# Patient Record
Sex: Female | Born: 1985 | Hispanic: Yes | Marital: Married | Smoking: Never smoker
Health system: Southern US, Community
[De-identification: ages and names within clinical notes are randomized; demographics above are authoritative.]

## PROBLEM LIST (undated history)

## (undated) DIAGNOSIS — E119 Type 2 diabetes mellitus without complications: Principal | ICD-10-CM

## (undated) DIAGNOSIS — Z3A23 23 weeks gestation of pregnancy: Secondary | ICD-10-CM

## (undated) DIAGNOSIS — Z3A25 25 weeks gestation of pregnancy: Secondary | ICD-10-CM

## (undated) DIAGNOSIS — O24419 Gestational diabetes mellitus in pregnancy, unspecified control: Secondary | ICD-10-CM

## (undated) HISTORY — PX: NO PAST SURGERIES: SHX2092

## (undated) HISTORY — DX: Type 2 diabetes mellitus without complications: E11.9

## (undated) HISTORY — DX: Gestational diabetes mellitus in pregnancy, unspecified control: O24.419

## (undated) MED FILL — INSULIN NPH ISOPHANE U-100 HUMAN 100 UNIT/ML SUBCUTANEOUS SUSPENSION: 100 100 unit/mL | SUBCUTANEOUS | 28 days supply | Qty: 10 | Fill #0

## (undated) MED FILL — INSULIN ASPART U-100  100 UNIT/ML SUBCUTANEOUS SOLUTION: 100 100 unit/mL | SUBCUTANEOUS | 28 days supply | Qty: 10 | Fill #0

## (undated) MED FILL — BLOOD-GLUCOSE METER: 30 days supply | Qty: 1 | Fill #0

---

## 2007-02-08 ENCOUNTER — Inpatient Hospital Stay

## 2010-09-17 ENCOUNTER — Ambulatory Visit: Admit: 2010-09-17 | Discharge: 2010-09-17 | Attending: Maternal & Fetal Medicine

## 2010-09-17 ENCOUNTER — Inpatient Hospital Stay: Attending: Maternal & Fetal Medicine

## 2010-09-17 DIAGNOSIS — Z36 Encounter for antenatal screening of mother: Secondary | ICD-10-CM

## 2010-09-21 ENCOUNTER — Institutional Professional Consult (permissible substitution): Admit: 2010-09-21 | Attending: Adult Health

## 2010-09-21 ENCOUNTER — Inpatient Hospital Stay

## 2010-09-21 DIAGNOSIS — E119 Type 2 diabetes mellitus without complications: Secondary | ICD-10-CM

## 2010-09-21 LAB — POC GLU MONITORING DEVICE: POC Glucose Monitoring Device: 93 mg/dL (ref 65–99)

## 2010-09-21 NOTE — Unmapped (Signed)
PATIENTS RANDOM BLOOD SUGAR FOR 09/21/10 WAS 93

## 2010-09-22 MED ORDER — blood sugar diagnostic Strp
ORAL_STRIP | Freq: Every day | 0.00 refills | 30.00000 days | Status: AC
Start: 2010-09-22 — End: 2014-04-01

## 2010-09-22 MED ORDER — blood-glucose meter kit
1.00 refills | 30.00000 days | Status: AC
Start: 2010-09-22 — End: 2011-09-22

## 2010-09-22 NOTE — Unmapped (Signed)
Nuchal done on 09/17/10. EDD 04/13/11.

## 2010-09-23 NOTE — Unmapped (Signed)
1 1/2 hours spent with pt.  Spanish interpreter Porfirio Mylar present for education.  Pt originally from [place] and has been in the states 4 years.  Understands little [place] but can read [place].  All educational materials provided in Spanish    25 yo G2P1001 @ [redacted] wks gestation with type 2 diabetes.  Pt referred from [facility] to DAPP.  Pt has been on metformin BID although pt states today that she has not taken in 8 days because it makes her feel sick.  Pt presents with BG log but is only testing fasting and after dinner.  Majority of BGs are WNL.  24 hour diet recall completed which shows pt somewhat carb restricting.  The only education she received was to reduce carb intake which resulted in her eliminating majority of carbs (rice, potatoes, juice, pasta, tortillas etc.)  Discussed importance of incorporating carbs in meal plan and healthy vs unhealthy choices.  Pt seemed happy to receive education as she was unaware of proper portion sizes.  Pt given meal plan based on BMI of 25 as follows:  Breakfast: Time: 7-8am Carbs:30 g    Snack:  Time: 9-10amCarbs: 15 g    Lunch:  Time: 12pm Carbs: 60 g    Snack:  Time: 2-3pm Carbs: 15 g    Dinner: Time: 5-6pm Carbs: 60 g    Snack:  Time:9-10pm Carbs:  30 g     Discussed with N.Linter CNS who would like pt to stop Metformin since she has not been taking for 8 days and FSBG are mostly WNL.  Pt instructed to start testing 7x/day to obtain a more accurate glycemic profile.  Pt is scheduled to return to office Thursday 8/30 at 10 am to see N.Lintner CNS and have A1C drawn. Pt given True Result meter as she does not have insurance and was paying out of pocket for testing supplies for the [employer].  Pt instructed to bring glucometer(s) and BG log to visit.

## 2010-09-24 ENCOUNTER — Inpatient Hospital Stay

## 2010-09-24 ENCOUNTER — Encounter: Attending: Clinical

## 2010-09-24 ENCOUNTER — Ambulatory Visit: Admit: 2010-09-24 | Attending: Adult Health

## 2010-09-24 DIAGNOSIS — E119 Type 2 diabetes mellitus without complications: Secondary | ICD-10-CM

## 2010-09-24 LAB — POC URINALYSIS
Bilirubin, UA: NEGATIVE
Blood, POC, UA: NEGATIVE
Glucose, POC, UA: NEGATIVE mg/dL
Ketones, POC, UA: NEGATIVE mg/dL
Nitrite, POC, UA: NEGATIVE
Protein, POC, UA: NEGATIVE mg/dL
Spec Grav, UA: >=1.03 (ref 1.005–1.035)
Urobilinogen, POC, UA: 0.2 mg/dL (ref 0.2–1.0)
pH, POC,UA: 5.5 (ref 5.0–8.0)

## 2010-09-24 LAB — GLUCOSE CHALLENGE, 1 HOUR: Glucose Challenge, 1 Hr: 107 mg/dL

## 2010-09-24 LAB — HEMOGLOBIN A1C: Hemoglobin A1C: 5.4 % (ref 4.8–6.4)

## 2010-09-24 MED ORDER — prenatal vit-iron fumarate-FA 27-1 mg Tab
27-1 | ORAL_TABLET | Freq: Every day | ORAL | 3.00 refills | 30.00000 days | Status: AC
Start: 2010-09-24 — End: 2010-10-24

## 2010-09-24 NOTE — Unmapped (Signed)
25 y/o Hispanic female presents at 75 w 2d with hx of T2DM.  Pt reports that she has not taken her Metformin for 15 days.  BGs reviewed. See assessment. Patient denies pregnancy complaints except for constipation and H/A.  Pt was instructed to stop stool softener because she is pregnant.  Pt states that she was dx'd with T2DM at one of the outlying clinics with a FSBG > 200 mg/dL and a urine.  Pt reports losing 40 lbs since then with bgs no greater than 120 mg/dL.  Pt reports 1st trimester H/A.  Assessment:  U/S on 09/17/10 reveals that pt is 10.2 wks pregnancy with a final EDD of 04/13/11.  Self reported BGs:  FBG 82, 83, 90; 1 hr PP breakfast 138, 95; ac lunch 81, 74; 1 hr PP lunch 87, 84; ac dinner 89, 78, 85; 1 hr PP dinner 117, 129, 108; pre-hs snack 89, 73 mg/dL.    Plan:  A1c, 1 hr GCT, urine dipstick.  Counseling about how to maintain a healthy pregnancy.  Discussed how insulin requirements increase throughout pregnancy beginning at 24 wks and she is at risk for GDM. F/U after lab results reviewed.  Ca++ 1000 mg for c/o 1st trimester H/As.  Report to Dr. Logan Bores. Kathyrn Sheriff. Atsushi Yom, MS, ACNS, RNC-OB
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
## 2010-09-24 NOTE — Unmapped (Addendum)
Spanish Interpreter present.  Informed pt that we would like to order A1c, urine dipstick and 1 hr gct since her bgs have been normal for the last 15 days.  Take CA++ 1000 mg for c/o 1st trimester H/As. Inst pt how to take Tylenol for H/A.  Inst pt how to take Senokot for constipation and to begin PNVs as instructed.   Kathyrn Sheriff. Kemi Gell, MS, ACNS, RNC-OB

## 2010-10-07 ENCOUNTER — Inpatient Hospital Stay

## 2010-10-07 ENCOUNTER — Other Ambulatory Visit: Admit: 2010-10-07 | Attending: Adult Health

## 2010-10-07 ENCOUNTER — Encounter: Attending: Clinical

## 2010-10-07 ENCOUNTER — Ambulatory Visit

## 2010-10-07 ENCOUNTER — Ambulatory Visit: Admit: 2010-10-07 | Attending: Maternal & Fetal Medicine

## 2010-10-07 DIAGNOSIS — Z36 Encounter for antenatal screening of mother: Secondary | ICD-10-CM

## 2010-10-07 DIAGNOSIS — F32A Depression, unspecified: Secondary | ICD-10-CM

## 2010-10-07 NOTE — Unmapped (Signed)
25 y/o Hispanic female presents at 13.1 wks for follow visit with APRN and NT in diagnostics.  Pt present on 09/24/10 w/previous hx of T2DM.  Pt was tested and passed 1 hr GCT.  BGs reviewed.  Optimal glycemic control noted.  3 bgs outside targets for pregnancy over the last 2 wks.  Inst pt with assistance of interpreter that pt may return to low risk clinic for OB care.  Pt states she will got to [facility].  Inst pt to reinitiate bg testing if she develops excessive thirst or urination.  Inst pt to request records for transfer of care.  Inst pt to have 1 hr GCT and 3 hr OGTT between 24-28 wks to ensure that she has not developed GDM.  Pt agrees to plan. Kathyrn Sheriff. Kateena Degroote, MS, ACNS, RNC-OB

## 2010-10-07 NOTE — Unmapped (Signed)
Take ROR to new provider to get record from [facility].  Reinitiate bg testing if s/s excessive thirst or urination. Enc pt to drink SF beverages, watch portions and avoid excessive wt gain during pregnancy.  F/U for 1 hr GCT and 3 hr OGTT if necessary to r/o GDM between 24-28 wks. Pt agrees to plan.  Kathyrn Sheriff. Talise Sligh, MS, ACNS, RNC-OB

## 2010-10-07 NOTE — Unmapped (Signed)
SW met w/pt & Energy manager.  SW informed pt need to sign another ROI for [employer] C[employer], as had due date on last request, yet pt wants no medical information shared.  Pt signed form.  SW will call pt at [phone] after contacts CPS.

## 2010-10-07 NOTE — Unmapped (Signed)
SW called [employer] - informed of request.  Fax request to  [phone].  SW faxed request.     Cassandria Anger of [employer] returned call.  SW explained request of birth mom.  He questions why pt doesn't call agency directly.  Pt does not think they answer her questions.  Mr. Kennyth Arnold stated pt needs to call him at [phone] with Interpreter or if he knows when she's going to call, he can have interpreter available.  SW appreciative for help.     SW called pt 10/20/10 - via Interpreter [id number].  Inform pt of above - pt has no one to translate so request SW call Mr. Kennyth Arnold back & arrange phone call for 10/22/10 at 10am.  Gave pt his phone number.  SW called Mr. Kennyth Arnold - must go through phone tree, then asked for him, LVMM to see if he has direct line as pt won't be able to navigate phone tree on own, yet planning to call Thurs 10/22/10 at 10am.  Request call back    SW called pt - [employer] [phone] - to see if pt called [facility].  PT didn't have minutes on Thurs, so called Fri, yet couldn't understand phone options.  SW will call [facility] & call pt back with specific directions so can know status of situation.     SW called Mr. Michel Bickers 10/28/10 - SW explained pt attempted to call, yet can't navigate phone tree.  He states pt needs to speak to her CM - Rayburn Felt or her attorney Asencion Gowda [phone], as he is not involved in the case.  SW then called main # [phone] - LVMM for Rayburn Felt to set phone appointment for pt to call him, with [facility] Interpreter to assist - request call back.

## 2010-10-20 NOTE — Unmapped (Signed)
PROCEDURE ONLY EPIC BILLING

## 2010-11-13 NOTE — Unmapped (Signed)
SW called pt via [employer].  SW inform pt of phone call with [employer] - directed to her case worker Rayburn Felt or her attorney Asencion Gowda.  Pt stated that her attorney called her last week & is to call her next week.  SW offered for pt to contact out Interpreting Dept if issues not resolved or if needs assistance with interpreting, as they can contact SW.  Pt stated understanding.  Pt then asked if this SW knows anything about her daughter.  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  Wish pt well.

## 2011-02-07 NOTE — Unmapped (Signed)
Phone call not indicated  Patient stated she would continue prenatal care at [facility]  See note by N Lintner   10/07/2010

## 2011-02-23 NOTE — Unmapped (Signed)
SW returned call as pt requested help w/newborn classes.  Used [employer] - pt is delivering at [facility] yet missed their classes, so wondered if [facility] had any.  SW agreed to locate current schedule & call pt back.  SW located info - call pt & direct her to call [phone].  Also suggest pt work with [employer] Spanish SW Sports coach.  Pt stated she is already.  Appreciative of help.

## 2011-03-14 NOTE — Unmapped (Signed)
Patient has transferred care to [facility] per phone note 02/23/11 by Fredrich Birks.
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
## 2012-01-26 NOTE — L&D Delivery Note (Signed)
Delivery Note Pt had SROM at 0140 with copious amounts of clear fluid. At 0200, she was C/C/+2 with an urge to push. At 2:12 AM a viable female was delivered via Vaginal, Spontaneous Delivery (Presentation:LOA ;  ).  APGAR: 9, 9; weight pending .   Placenta status: Intact, Spontaneous.  Cord: 3 vessels with the following complications: None.   Anesthesia: Epidural  Episiotomy: None Lacerations: 1st degree perineal Suture Repair: 2.0 vicryl Est. Blood Loss (mL): 400  Mom to postpartum.  Baby to nursery-stable  Delivery and repair by Dr. Berline Chough under my supervision.  [name],[name] 05/19/2012, 2:23 AM

## 2012-04-25 ENCOUNTER — Other Ambulatory Visit: Payer: Self-pay | Admitting: Obstetrics & Gynecology

## 2012-04-25 ENCOUNTER — Other Ambulatory Visit (INDEPENDENT_AMBULATORY_CARE_PROVIDER_SITE_OTHER): Payer: Self-pay

## 2012-04-25 DIAGNOSIS — Z3201 Encounter for pregnancy test, result positive: Secondary | ICD-10-CM

## 2012-04-25 LAB — HIV ANTIBODY (ROUTINE TESTING W REFLEX): HIV: NONREACTIVE

## 2012-04-25 LAB — POCT PREGNANCY, URINE: Preg Test, Ur: POSITIVE — AB

## 2012-04-26 ENCOUNTER — Ambulatory Visit (HOSPITAL_COMMUNITY)
Admission: RE | Admit: 2012-04-26 | Discharge: 2012-04-26 | Disposition: A | Discharge status: Home / Self Care | Payer: Self-pay | Source: Ambulatory Visit | Attending: Obstetrics & Gynecology | Admitting: Obstetrics & Gynecology

## 2012-04-26 ENCOUNTER — Other Ambulatory Visit: Payer: Self-pay | Admitting: Obstetrics & Gynecology

## 2012-04-26 DIAGNOSIS — O99810 Abnormal glucose complicating pregnancy: Secondary | ICD-10-CM | POA: Insufficient documentation

## 2012-04-26 DIAGNOSIS — Z3201 Encounter for pregnancy test, result positive: Secondary | ICD-10-CM

## 2012-04-26 DIAGNOSIS — Z3689 Encounter for other specified antenatal screening: Secondary | ICD-10-CM | POA: Insufficient documentation

## 2012-04-26 LAB — OBSTETRIC PANEL
Antibody Screen: NEGATIVE
Basophils Relative: 0 % (ref 0–1)
Eosinophils Relative: 0 % (ref 0–5)
HCT: 35.1 % — ABNORMAL LOW (ref 36.0–46.0)
Hemoglobin: 11.9 g/dL — ABNORMAL LOW (ref 12.0–15.0)
MCHC: 33.9 g/dL (ref 30.0–36.0)
MCV: 80.9 fL (ref 78.0–100.0)
Monocytes Absolute: 0.6 10*3/uL (ref 0.1–1.0)
Monocytes Relative: 6 % (ref 3–12)
Neutro Abs: 7.3 10*3/uL (ref 1.7–7.7)
RDW: 14.2 % (ref 11.5–15.5)
Rh Type: POSITIVE
Rubella: 13.3 Index — ABNORMAL HIGH (ref <0.90)

## 2012-04-27 LAB — HEMOGLOBINOPATHY EVALUATION: Hgb A2 Quant: 2.9 % (ref 2.2–3.2)

## 2012-05-08 ENCOUNTER — Encounter: Payer: Self-pay | Admitting: Obstetrics and Gynecology

## 2012-05-08 ENCOUNTER — Ambulatory Visit (INDEPENDENT_AMBULATORY_CARE_PROVIDER_SITE_OTHER): Payer: Self-pay | Admitting: Obstetrics and Gynecology

## 2012-05-08 VITALS — BP 112/89 | Temp 97.2°F | Ht <= 58 in | Wt 163.0 lb

## 2012-05-08 DIAGNOSIS — O24919 Unspecified diabetes mellitus in pregnancy, unspecified trimester: Secondary | ICD-10-CM

## 2012-05-08 DIAGNOSIS — O24913 Unspecified diabetes mellitus in pregnancy, third trimester: Secondary | ICD-10-CM

## 2012-05-08 DIAGNOSIS — O24119 Pre-existing diabetes mellitus, type 2, in pregnancy, unspecified trimester: Secondary | ICD-10-CM | POA: Insufficient documentation

## 2012-05-08 DIAGNOSIS — Z3403 Encounter for supervision of normal first pregnancy, third trimester: Secondary | ICD-10-CM

## 2012-05-08 DIAGNOSIS — E119 Type 2 diabetes mellitus without complications: Secondary | ICD-10-CM

## 2012-05-08 DIAGNOSIS — O0930 Supervision of pregnancy with insufficient antenatal care, unspecified trimester: Secondary | ICD-10-CM

## 2012-05-08 DIAGNOSIS — O0933 Supervision of pregnancy with insufficient antenatal care, third trimester: Secondary | ICD-10-CM

## 2012-05-08 LAB — COMPREHENSIVE METABOLIC PANEL
ALT: 10 U/L (ref 0–35)
AST: 12 U/L (ref 0–37)
Alkaline Phosphatase: 186 U/L — ABNORMAL HIGH (ref 39–117)
Potassium: 4.1 mEq/L (ref 3.5–5.3)
Sodium: 135 mEq/L (ref 135–145)
Total Bilirubin: 0.4 mg/dL (ref 0.3–1.2)
Total Protein: 5.9 g/dL — ABNORMAL LOW (ref 6.0–8.3)

## 2012-05-08 LAB — POCT URINALYSIS DIP (DEVICE)
Bilirubin Urine: NEGATIVE
Glucose, UA: >=1000 mg/dL — AB
Nitrite: NEGATIVE
Specific Gravity, Urine: >=1.03 (ref 1.005–1.030)
Urobilinogen, UA: 1 mg/dL (ref 0.0–1.0)

## 2012-05-08 LAB — CBC
MCH: 27.1 pg (ref 26.0–34.0)
MCHC: 34 g/dL (ref 30.0–36.0)
Platelets: 283 10*3/uL (ref 150–400)

## 2012-05-08 MED ORDER — GLYBURIDE 2.5 MG PO TABS: 2.5000 mg | ORAL_TABLET | Freq: Two times a day (BID) | ORAL | Status: DC

## 2012-05-08 MED ORDER — PRENATAL VITAMINS 0.8 MG PO TABS: 1.0000 | ORAL_TABLET | Freq: Every day | ORAL | Status: DC

## 2012-05-08 NOTE — Progress Notes (Signed)
Nutrition note: 1st visit consult Pt has Type 2 DM and h/o obesity. Pt has gained 13# @ [redacted]w[redacted]d, which is wnl. Pt reports eating 3x/d. Pt reports having no N&V or heartburn but some constipation.  Pt is not taking PNV. Pt received verbal & written education on GDM diet in Spanish. Disc importance of PNV. Disc tips to decrease constipation.  Disc wt gain goals of 11-20# or 0.5#/wk. Pt agrees to start taking PNV and follow GDM diet with 3 meals & 3 snacks/d with proper CHO/protein combination. Pt does not have WIC but plans to apply. Pt plans to BF. F/u in 2-4 wks Blondell Reveal, MS, RD, LDN

## 2012-05-08 NOTE — Patient Instructions (Signed)
Embarazo  Systems analyst trimestre  (Pregnancy - Third Trimester) El tercer trimestre del Psychiatrist (los ltimos 3 meses) es el perodo en el cual tanto usted como su beb crecen con ms rapidez. El beb alcanza un largo de aproximadamente 50 cm. y pesa entre 2,700 y 4,500 kg. El beb gana ms tejido graso y est listo para la vida fuera del cuerpo de la [place]. Mientras estn en el interior, los bebs tienen perodos de sueo y vigilia, Warehouse manager y tienen hipo. Quizs sienta pequeas contracciones del tero. Este es el falso trabajo de [place]. Tambin se las conoce como contracciones de [place] . Es como una prctica del parto. Los problemas ms habituales de esta etapa del embarazo incluyen mayor dificultad para respirar, hinchazn de las manos y los pies por retencin de lquidos y la necesidad de Geographical information systems officer con ms frecuencia debido a que el tero y el beb presionan sobre la vejiga.  EXAMENES PRENATALES   Durante los Manpower Inc, deber seguir realizndose anlisis de [place]. Estas pruebas se realizan para controlar su salud y la del beb. Los [employer] Corporation de sangre se Radiographer, therapeutic para [employer] niveles de algunos compuestos de la sangre (hemoglobina). La anemia (bajo nivel de hemoglobina) es frecuente durante el embarazo. Para prevenirla, se administran hierro y vitaminas. Tambin le tomarn nuevas anlisis para descartar diabetes. Podrn repetirle algunas de las Hovnan[employer] Enterprises hicieron previamente.  En cada visita le medirn el tamao del tero. Esto permite asegurar que el beb se desarrolla adecuadamente, segn la fecha del embarazo.  Le controlarn la presin arterial en cada visita prenatal. Esto es para asegurarse de que no sufre toxemia.  Le harn un anlisis de orina en cada visita prenatal, para descartar infecciones, diabetes y la presencia de protenas.  Tambin en cada visita controlarn su peso. Esto se realiza para asegurarse que aumenta de peso al ritmo indicado y que usted y  su beb evolucionan normalmente.  En algunas ocasiones se realiza una prueba de ultrasonido para confirmar el correcto desarrollo y evolucin del beb. Esta prueba se realiza con ondas sonoras inofensivas para el beb, de modo que el profesional pueda calcular ms precisamente la fecha del [place].  Analice con su mdico los analgsicos y la anestesia que recibir durante el [place] de parto y [place].  Comente la posibilidad de que necesite una cesrea y qu anestesia se recibir.  Informe a su mdico si sufre violencia familiar mental o fsica. A veces, se indica la prueba especializada sin estrs, la prueba de tolerancia a las contracciones y el perfil biofsico para asegurarse de que el beb no tiene problemas. El estudio del lquido amnitico que rodea al beb se llama amniocentesis. El lquido amnitico se obtiene introduciendo una aguja en el vientre (abdomen ). En ocasiones se lleva a cabo cerca del final del embarazo, si es necesario inducir a un parto. En este caso se realiza para asegurarse que los pulmones del beb estn lo suficientemente maduros como para que pueda vivir fuera del tero. Si los pulmones no han madurado y es peligroso que el beb nazca, se Building services engineer a la madre una inyeccin de [place] , 1 a 2 [address]  [address] antes del [address]. Vivia Budge ayuda a que los pulmones del beb maduren y sea ms seguro su nacimiento.  CAMBIOS QUE OCURREN EN EL TERCER TRIMESTRE DEL EMBARAZO  Su organismo atravesar numerosos cambios durante el [place]. Estos pueden variar de Neomia Dear persona a otra. Converse con el profesional que la asiste acerca los cambios que  usted note y que la preocupen.   Durante el ltimo trimestre probablemente sienta un aumento del apetito. Es normal tener "antojos" de Development worker, community. Esto vara de Neomia Dear persona a otra y de un embarazo a Therapist, art.  Podrn aparecer las primeras estras en las caderas, abdomen y [place]. Estos son cambios normales del cuerpo durante el [place]. No existen  medicamentos ni ejercicios que puedan prevenir [employer]Max.  La constipacin puede tratarse con un laxante o agregando fibra a su dieta. Beber grandes cantidades de lquidos, tomar fibras en forma de vegetales, frutas y granos integrales es de gran [place].  Tambin es beneficioso practicar actividad fsica. Si ha sido una persona Engineer, mining, podr continuar con la Harley[employer]son de las actividades durante el mismo. Si ha sido [employer], puede ser beneficioso que comience con un programa de ejercicios, Museum/gallery exhibitions officer. Consulte con el profesional que la asiste antes de comenzar un programa de ejercicios.  Evite el consumo de cigarrillos, el alcohol, los medicamentos no recetados y las "drogas de la calle" durante el Psychiatrist. Estas sustancias qumicas afectan la formacin y el desarrollo del beb. Evite estas sustancias durante todo el embarazo para asegurar el nacimiento de un beb sano.  Podr sentir dolor de espalda, tener vrices en las venas y hemorroides, o si ya los sufra, pueden [place].  Durante el tercer trimestre se cansar con ms facilidad, lo cual es normal.  Los movimientos del beb pueden ser ms fuertes y con ms frecuencia.  Puede que note dificultades para respirar normalmente.  El ombligo puede salir hacia afuera.  A veces sale Veterinary surgeon de las [place], que se llama Product manager.  Podr aparecer Neomia Dear secrecin mucosa con sangre. Esto suele ocurrir General Electric unos [address] y Neomia Dear semana antes del [place]. INSTRUCCIONES PARA EL CUIDADO EN EL HOGAR   Cumpla con las citas de control. Siga las indicaciones del mdico con respecto al uso de [place], los ejercicios y la dieta.  Durante el embarazo debe obtener nutrientes para usted y para su beb. Consuma alimentos balanceados a intervalos regulares. Elija alimentos como carne, pescado, [place] y otros productos lcteos descremados, vegetales, frutas, panes integrales y cereales. El Office [employer]ot informar  cul es el aumento de peso ideal.  Las relaciones sexuales pueden continuarse hasta casi el final del embarazo, si no se presentan otros problemas como prdida prematura (antes de [place]) de lquido amnitico, hemorragia vaginal o dolor en el vientre (abdominal).  Realice Tesoro Corporation, si no tiene restricciones. Consulte con el profesional que la asiste si no sabe con certeza si determinados ejercicios son seguros. El mayor aumento de peso se producir en los ltimos 2 trimestres del Psychiatrist. El ejercicio ayuda a:  Engineering geologist.  Mantenerse en forma para el trabajo de parto y [place] .  Perder peso despus del parto.  Haga reposo con frecuencia, con las piernas elevadas, o segn lo necesite para evitar los calambres y el dolor de cintura.  Use un buen sostn o como los que se usan para hacer deportes para Paramedic la sensibilidad de las [place]. Tambin puede serle til si lo [place] mientras duerme. Si pierde Product manager, podr Parker Hann[employer]in.  No utilice la baera con agua caliente, baos turcos y saunas.  Colquese el cinturn de seguridad cuando conduzca. Este la proteger a usted y al beb en caso de accidente.  Evite comer carne cruda y el contacto con los utensilios y desperdicios de los gatos. Estos elementos  contienen grmenes que pueden causar defectos de nacimiento en el beb.  Es fcil perder algo de orina durante el [place]. Apretar y Chief Operating Officer los msculos de la pelvis la ayudar con este problema. Practique detener la miccin cuando est en el bao. Estos son los mismos msculos que Development worker, international aid. Son [employer]PP[employer] Partners mismos msculos que utiliza cuando trata de evitar despedir gases. Puede practicar apretando estos msculos WellPoint, y repetir esto tres veces por da aproximadamente. Una vez que conozca qu msculos debe apretar, no realice estos ejercicios durante la miccin. Puede favorecerle una infeccin si la orina vuelve hacia  atrs.  Pida ayuda si tienen necesidades financieras, teraputicas o nutricionales. El profesional podr ayudarla con respecto a estas necesidades, o derivarla a otros especialistas.  Haga una lista de nmeros telefnicos de emergencia y tngalos disponibles.  Planifique como obtener ayuda de familiares o amigos cuando regrese a Programmer, applications hospital.  Hacer un ensayo sobre la partida al hospital.  [place]aven clases prenatales con el padre para entender, practicar y hacer preguntas sobre el [place] de parto y el alumbramiento.  Preparar la habitacin del beb / busque Fatima Blank.  No viaje fuera de la ciudad a menos que sea absolutamente necesario y con el asesoramiento de su mdico.  Use slo zapatos de tacn bajo o sin tacn para tener mejor equilibrio y Automotive engineer cadas. USO DE MEDICAMENTOS Y CONSUMO DE DROGAS DURANTE EL [facility]   Tome las vitaminas apropiadas para esta etapa tal como se le indic. Las vitaminas deben contener un miligramo de cido flico. Guarde todas las vitaminas fuera del alcance de los nios. La ingestin de slo un par de vitaminas o tabletas que contengan hierro pueden ocasionar la Newmont Mining en un beb o en un nio pequeo.  Evite el uso de [employer] Mutual [employer] Omaha, incluyendo hierbas, medicamentos de [place], sin receta o que no hayan sido sugeridos por su mdico. Slo tome medicamentos de venta libre o medicamentos recetados para Chief Technology Officer, Environmental health practitioner o fiebre como lo indique su mdico. No tome aspirina, ibuprofeno (Motrin, Advil, Nuprin) o naproxeno (Aleve) excepto que su mdico se lo indique.  Infrmele al profesional si consume alguna droga.  El alcohol se relaciona con ciertos defectos congnitos. Incluye el sndrome de alcoholismo fetal. Debe evitar absolutamente el consumo de alcohol, en cualquier forma. El fumar produce baja tasa de natalidad y bebs prematuros.  Las drogas ilegales o de la calle son muy perjudiciales para el beb. Estn absolutamente  prohibidas. Un beb que nace de [employer] Express, ser adicto al nacer. Ese beb tendr los mismos sntomas de abstinencia que un adulto. SOLICITE ATENCIN MDICA SI:  Tiene preguntas o preocupaciones relacionadas con el embarazo. Es mejor que llame para formular las preguntas si no puede esperar hasta la prxima visita, que sentirse preocupada por ellas.  DECISIONES ACERCA DE LA CIRCUNCISIN  Usted puede saber o no cul es el sexo de su beb. Si ya sabe que ser un varn, este es el momento de pensar acerca de la circuncisin. La circuncisin es la extirpacin del prepucio. Esta es la piel que cubre el extremo sensible del pene. No hay un motivo mdico que lo justifique. Generalmente la decisin se toma segn lo que sea popular en ese momento, o segn creencias religiosas. Podr conversar estos temas con su mdico o con el pediatra.  SOLICITE ATENCIN MDICA DE INMEDIATO SI:   La temperatura oral le sube a ms de 100.4 F (38 C) o lo que su mdico le  indique.  Tiene una prdida de lquido por la vagina (canal de parto). Si sospecha una ruptura de las [place], tmese la temperatura y llame al profesional para informarlo sobre esto.  Observa unas pequeas manchas, una hemorragia vaginal o elimina cogulos. Notifique al profesional acerca de la cantidad y de cuntos apsitos est utilizando.  Presenta un olor desagradable en la secrecin vaginal y observa un cambio en el color, de transparente a blanco.  Ha vomitado durante ms de 24 horas.  Siente escalofros o le sube la fiebre.  Le falta el aire.  Siente ardor al Beatrix Shipper.  [place]aja o sube ms de 2 libras (900 g), o segn lo indicado por el profesional que la asiste.  Observa que sbitamente se le hinchan el rostro, las manos, los pies o las piernas.  Siente dolor en el vientre (abdominal). Las Federal[employer]ogul en el ligamento redondo son Neomia Dear causa benigna frecuente de dolor abdominal durante el embarazo. El profesional que la asiste deber  evaluarla.  Presenta dolor de cabeza intenso que no se [place].  Tiene problemas visuales, visin doble o borrosa.  Si no siente los movimientos del beb durante ms de 1 hora. Si piensa que el beb no se mueve tanto como lo haca habitualmente, coma algo que Psychologist, clinical y Target Corporation lado izquierdo durante [place]. El beb debe moverse al menos 4  5 veces por hora. Comunquese inmediatamente si el beb se mueve menos que lo indicado.  Se cae, se ve involucrada en un accidente automovilstico o sufre algn tipo de traumatismo.  En su hogar hay violencia mental o fsica. Document Released: 10/21/2004 Document Revised: 07/13/2011 [facility] Patient Information 2013 [place], [place].  Eleccin del mtodo anticonceptivo  (Contraception Choices) La anticoncepcin (control de la natalidad) es el uso de cualquier mtodo o dispositivo para Location manager. A continuacin se indican algunos de esos mtodos.  MTODOS HORMONALES   Implante anticonceptivo. Es un tubo plstico delgado que contiene la hormona progesterona. No contiene estrgenos. El mdico inserta el tubo en la parte interna del brazo. El tubo puede Geneticist, molecular durante 3 aos. Despus de los 3 aos debe retirarse. El implante impide que los ovarios liberen vulos (ovulacin), espesa el moco cervical, lo que evita que los espermatozoides ingresen al tero y hace ms delgada la membrana que cubre el interior del tero.  Inyecciones de progesterona sola. Estas inyecciones se administran cada 3 meses para evitar el embarazo. La progesterona sinttica impide que los ovarios liberen vulos. Tambin hace que el moco cervical se espese y modifica el recubrimiento interno del tero. Esto hace ms difcil que los espermatozoides sobrevivan en el tero.  Pldoras anticonceptivas. Las pldoras anticonceptivas contienen estrgenos y Education officer, museum. Actan impidiendo que el vulo se forme en el ovario(ovulacin). Las pldoras  anticonceptivas son recetadas por el mdico.Tambin se utilizan para tratar los perodos menstruales abundantes.  Minipldora. Este tipo de pldora anticonceptiva contiene slo hormona progesterona. Deben tomarse todos los [address]  [address] del mes y debe recetarlas el mdico.  Parches anticonceptivos. El parche contiene hormonas similares a las que contienen las pldoras anticonceptivas. Deben cambiarse una vez por semana y se utilizan bajo prescripcin mdica.  Anillo vaginal. Anillo vaginal contiene hormonas similares a las que contienen las pldoras anticonceptivas. Se deja colocado durante tres semanas, se lo retira durante 1 semana y luego se coloca uno nuevo. La paciente debe sentirse cmoda para insertar y retirar el anillo de la vagina.Es necesaria la receta del mdico.  Anticonceptivos de Associate Professor. Los anticonceptivos de  emergencia son mtodos para evitar un embarazo despus de Neomia Dear relacin sexual sin proteccin. Esta pldora puede tomarse inmediatamente despus de Child psychotherapist sexuales o hasta 5 [place] de haber tenido sexo sin proteccin. Es ms efectiva si se toma poco tiempo despus. Los anticonceptivos de emergencia estn disponibles sin prescripcin mdica. Consltelo con su farmacutico. No use los anticonceptivos de emergencia como nico mtodo anticonceptivo. MTODOS DE BARRERA   Condn masculino. Es una vaina delgada (ltex o goma) que se [place] en el pene durante el acto sexual. Deri Fuelling con espermicida para aumentar la efectividad.  Condn femenino. Es una vaina blanda y floja que se adapta suavemente a la vagina antes de las relaciones sexuales.  Diafragma. Es una barrera de ltex redonda y Casimer Bilis que debe ser ajustada por un profesional. Se inserta en la vagina, junto con un gel espermicida. Debe insertarse antes de Management consultant. Debe dejar el diafragma colocado en la vagina durante 6 a 8 horas despus de la relacin sexual.  Capuchn cervical. Es una taza de ltex o  plstico, redonda y [place] que cubre el cuello del tero y debe ser ajustada por un mdico. Puede dejarlo colocado en la vagina hasta 48 horas despus de las Clinical research associate.  Esponja. Es una pieza blanda y circular de espuma de poliuretano. Contiene un espermicida. Se inserta en la vagina despus de mojarla y antes de las [employer] St. [employer].  Espermicidas. Los espermicidas son qumicos que matan o bloquean el esperma y no lo dejan ingresar al cuello del tero y al tero. Vienen en forma de cremas, geles, supositorios, espuma o comprimidos. No es necesario tener Emergency planning/management officer. Se insertan en la vagina con un aplicador antes de Management consultant. El proceso debe repetirse cada vez que tiene relaciones sexuales. ANTICONCEPTIVOS INTRAUTERINOS   Dispositivo intrauterino (DIU). Es un dispositivo en forma de T que se coloca en el tero durante el perodo menstrual, para Location manager. Hay dos tipos:  DIU de cobre. Este tipo de DIU est recubierto con un alambre de cobre y se inserta dentro del tero. El cobre hace que el tero y las trompas de Falopio produzcan un liquido que [employer] Stores espermatozoides. Puede permanecer colocado durante 10 aos.  DIU hormonal. Este tipo de DIU contiene la hormona progestina (progesterona sinttica). La hormona espesa el moco cervical y evita que los espermatozoides ingresen al tero y tambin afina la membrana que cubre el tero para evitar la implantacin del vulo fertilizado. La hormona debilita o destruye los espermatozoides que ingresan al tero. Puede permanecer colocado durante 5 aos. MTODOS ANTICONCEPTIVOS PERMANENTES   Ligadura de trompas en la mujer. La ligadura de trompas en la mujer se realiza sellando, atando u obstruyendo quirrgicamente las trompas de Falopio lo que impide que el vulo descienda hacia el tero.  Esterilizacin masculina. Se realiza atando los conductos por los que pasan los espermatozoides (vasectoma).Esto impide que  el esperma ingrese a la vagina durante el acto sexual. Luego del procedimiento, el hombre puede eyacular lquido (semen). MTODOS DE PLANIFICACIN NATURAL   Planificacin familiar natural.  Consiste en no tener relaciones sexuales o usar un mtodo de barrera (condn, Clio, capuchn cervical) en los [employer] la mujer podra quedar [place].  Mtodo calendario.  Consiste en el seguimiento de la duracin de cada ciclo menstrual y la identificacin de los perodos frtiles.  Mtodo de Occupational hygienist.  Consiste en evitar las relaciones sexuales durante la ovulacin.  Mtodo sintotrmico. Paramedic las relaciones sexuales en la poca  en la que se est ovulando, utilizando un termmetro y tendiendo en cuenta los sntomas de la ovulacin.  Mtodo post-ovulacin. Consiste en planificar las relaciones sexuales para despus de haber ovulado. Independientemente del tipo o mtodo anticonceptivo que usted elija, es importante que use condones para protegerse contra las enfermedades de transmisin sexual (ETS). Hable con su mdico con respecto a qu mtodo anticonceptivo es el ms apropiado para usted.  Document Released: 01/11/2005 Document Revised: 04/05/2011 [facility] Patient Information 2013 [place], [place].  Lactancia materna  (Breastfeeding) Decidir Museum/gallery exhibitions officer es una de las mejores elecciones que puede hacer por usted y su beb. La informacin que se brinda a Psychologist, clinical dar una breve visin de los beneficios de la lactancia materna as como de las dudas ms frecuentes alrededor de ella.  LOS BENEFICIOS DE AMAMANTAR  Para el beb   La primera leche (calostro ) ayuda al mejor funcionamiento del sistema digestivo del beb.   La leche tiene anticuerpos que provienen de la madre y que ayudan a prevenir las infecciones en el beb.   El beb tiene una menor incidencia de asma, alergias y del sndrome de muerte sbita del lactante (SMSL).   Los nutrientes en la [place] materna son  mejores para el beb que los preparados para lactantes y la [place] materna ayuda a un mejor desarrollo del cerebro del beb.   Los bebs amamantados tienen menos gases, clicos y estreimiento.  Para la mam   La lactancia materna favorece el desarrollo de un vnculo muy especial entre la madre y el beb.   Es ms conveniente, siempre disponible y a Landscape architect y Film/video editor.   Consume caloras en la madre y la ayuda a perder el peso ganado durante el [place].   Favorece la contraccin del tero a su tamao normal, de manera ms rpida y [employer]away las hemorragias luego del [place].   Las [employer] Holdings que amamantan tienen menor riesgo de Geophysical data processor de mama.  FRECUENCIA DEL AMAMANTAMIENTO   Un beb sano, nacido a trmino, puede amamantarse con tanta frecuencia como cada hora, o espaciar las comidas cada tres horas.   Observe al beb cuando manifieste signos de hambre. Amamante a su beb si muestra signos de hambre. Esta frecuencia variar de un beb a otro.   Amamntelo tan seguido como el beb lo solicite, o cuando usted sienta la necesidad de Paramedic sus [place].   Despierte al beb si han pasado 3  4 horas desde la ltima comida.   El amamantamiento frecuente la ayudar a producir ms [place] y a Education officer, community de Engineer, mining en los pezones e hinchazn de las [place].  POSICIN DEL BEBE PARA EL AMAMANTAMIENTO   Ya sea que se encuentre [place] o sentada, asegrese que el abdomen del beb enfrente el suyo.   Sostenga la mama con el pulgar por arriba y los otros 4 dedos por debajo. Asegrese que sus dedos se encuentren lejos del pezn y de la boca del beb.   Empuje suavemente los labios del beb con el pezn o con el dedo.   Cuando la boca del beb se abra lo suficiente, introduzca el pezn y la areola tanto como le sea posible dentro de la boca.   Coloque al beb cerca suyo de modo que su nariz y mejillas toquen las mamas al [employer].  ALIMENTACIN Y SUCCIN    La duracin de cada comida vara de un beb a otro y de Neomia Dear comida a [place].   El beb debe succionar entre 2 y  3 minutos para que le Reliant Energy. Esto se denomina "bajada". Por este motivo, permita que el nio se alimente en cada mama todo lo que desee. Terminar de mamar cuando haya recibido la cantidad [place] de nutrientes.   Para detener la succin coloque su dedo en la comisura de la boca del nio y Midwife entre sus encas antes de quitarle la mama de la boca. Esto la ayudar a English as a second language teacher.  COMO SABER SI EL BEB OBTIENE LA SUFICIENTE LECHE MATERNA  Preguntarse si el beb obtiene la cantidad suficiente de [place] es una preocupacin frecuente [employer]ent Technologies. Puede asegurarse que el beb tiene la leche suficiente si:   El beb succiona activamente y usted escucha que traga .   El beb parece estar relajado y satisfecho despus de Psychologist, clinical.   El nio se alimenta al menos 8 a 12 veces en 24 horas. Alimntelo hasta que se desprenda por sus propios medios o se quede dormido en la primera mama (al menos durante 10 a 20 minutos), luego ofrzcale el otro lado.   El beb moja 5 a 6 paales desechables (6 a 8 paales de tela) en 24 horas cuando tiene 5  6 das de vida.   Tiene al Lowe's Companies 3 a 4 deposiciones todos los [employer] primeros meses. La materia fecal debe ser blanda y [place].   El beb debe aumentar 4 a 6 libras (120 a 170 gr.) por semana despus de los 4 [address] de vida.   Siente que las mamas se ablandan despus de amamantar  REDUCIR LA CONGESTIN DE LAS MAMAS   Durante la primera semana despus del [place], usted puede experimentar hinchazn en las mamas. Cuando las mamas estn congestionadas, se sienten calientes, llenas y molestas al tacto. Puede reducir la congestin si:   Lo amamanta frecuentemente, cada 2-3 horas. Las mams que [employer] Corporation pronto y con frecuencia tienen menos problemas de [place].   Coloque compresas de hielo en sus mamas  durante 10-20 minutos entre cada amamantamiento. Esto ayuda a Building services engineer. Envuelva las bolsas de hielo en una toalla liviana para proteger su piel. Las bolsas de vegetales congelados funcionan bien para este propsito.   Tome una ducha tibia o aplique compresas hmedas calientes en las mamas durante 5 a 10 minutos antes de cada vez que [place]. Esto aumenta la circulacin y [place] a que la [place].   Masajee suavemente la mama antes y Psychologist, sport and exercise. Con las puntas de los dedos, masajee desde la pared torcica hacia abajo hasta llegar al pezn, con movimientos circulares.   Asegrese que el nio vaca al menos una mama antes de cambiar de lado.   Use un sacaleche para vaciar la mama si el beb se duerme o no se alimenta bien. Tambin podr [employer] con esa bomba si tiene que volver al trabajo o siente que las mamas estn congestionadas.   Evite los biberones, chupetes o complementar la alimentacin con agua o jugos en lugar de la [place]. La leche materna es todo el alimento que el beb necesita. No es necesario que el nio ingiera agua o preparados de bibern. Louann Liv, es lo mejor para ayudar a que las mamas produzcan ms [employer]. no darle suplementos al [employer] las primeras semanas.   Verifique que el beb se encuentra en la posicin correcta mientras lo alimenta.   Use un sostn que soporte bien sus mamas y [employer] que tienen aro.   Consuma  una dieta balanceada y beba lquidos en cantidad.   Descanse con frecuencia, reljese y tome sus vitaminas prenatales para evitar la fatiga, el estrs y la anemia.  Si sigue estas indicaciones, la congestin debe mejorar en 24 a 48 horas. Si an tiene dificultades, consulte a Barista.  CUDESE USTED MISMA  Cuide sus mamas.   Bese o dchese diariamente.   Evite usar [employer].   Comience a amamantar del lado izquierdo en una comida y del lado derecho en la siguiente.    Notar que aumenta el flujo de [place] a los 2 a 5 [address]  [address] despus del [address]. Puede sentir algunas molestias por la congestin, lo que hace que sus mamas estn duras y sensibles. La congestin disminuye en 24 a 48 horas. Mientras tanto, aplique toallas hmedas calientes durante 5 a 10 minutos antes de amamantar. Un masaje suave y la extraccin de un poco de leche antes de Museum/gallery exhibitions officer ablandarn las mamas y har ms fcil que el beb se agarre.   Use un buen sostn y seque al aire los pezones durante 3 a 4 minutos luego de Wellsite geologist.   Solo utilice apsitos de algodn.   Utilice lanolina [employer] pezones luego de [place]. No necesita lavarlos luego de alimentar al [employer]. Otra opcin es exprimir algunas gotas de [place] y [employer] pezones.  Cumpla con estos cuidados   Consuma alimentos bien balanceados y refrigerios nutritivos.   Dixie Dials, jugos de fruta y agua para Warehouse manager sed (alrededor de 8 vasos por Futures trader).   Descanse lo suficiente.  Evite los alimentos que usted note que pueden afectar al beb.  SOLICITE ATENCIN MDICA SI:   Tiene dificultad con la lactancia materna y [place].   Tiene una zona de color rojo, dura y dolorosa en la mama que se acompaa de [place].   El beb est muy somnoliento como para alimentarse bien o tiene problemas para dormir.   Su beb moja menos de 6 paales al [address], a los 5 [address]  [address] de vida.   La piel del beb o la parte blanca de sus ojos est ms amarilla de lo que estaba en el hospital.   Se siente deprimida.  Document Released: 01/11/2005 Document Revised: 07/13/2011 [facility] Patient Information 2013 [place], [place].

## 2012-05-08 NOTE — Progress Notes (Signed)
Diabetes Education:  With the assistance of the Spanish interprete, completed the review of the need for exercise, hypoglycemia and blood glucose monitoring.  Provided her a True Track Meter Kit,  NFA:[id number] EXP:2014/04/15 and one box strips QIO:[id number]  EXP: 2014/07/09, 1 box Lancets LOT: [id number]  Exp: 2016/10/13.  Instructed to monitor fasting and 2 hours after the first bite of each meal.  Glucose following the 1 hr Glucola dose was 324 mg/dl. MD prescribed Glyburide 2.5 mg at HS and Pre-breakfast.  Instructed to record blood glucose readings and to bring her meter and glucose log to all clinic appointments.  Maggie Cortlan Dolin, RN, RD, CDE

## 2012-05-08 NOTE — Progress Notes (Signed)
   Subjective:    [name] is a G1P0 [redacted]w[redacted]d being seen today for her first obstetrical visit.  Her obstetrical history is significant for late to care and Type 2 diabetes. Patient had prenatal care in [place] and is sure of her LMP. She was told in [place] that due to her diabetes, she will be induced a week prior to her due date which would be on 05/30/2012. Other than diabetes, patient reports uncomplicated prenatal care. She was taking anti-glycemic oral medication but does not remember the name. She was taking it twice a day. Patient does not recall address of OB office and thus prenatal records cannot be obtained. Patient does intend to breast feed. Pregnancy history fully reviewed.  Patient reports no complaints.  Filed Vitals:   05/08/12 0823 05/08/12 0828  BP: 112/89   Temp: 97.2 F (36.2 C)   Height:  4' 9.75" (1.467 m)    HISTORY: OB History   Grav Para Term Preterm Abortions TAB SAB Ect Mult Living   1              # Outc Date GA Lbr Len/2nd Wgt Sex Del Anes PTL Lv   1 CUR              Past Medical History  Diagnosis Date  . Diabetes mellitus without complication     states she was dx in [place]; no records avail   History reviewed. No pertinent past surgical history. Family History  Problem Relation Age of Onset  . Hypertension Mother      Exam    Uterus:     Pelvic Exam:    Perineum: Normal Perineum   Vulva: normal   Vagina:  normal mucosa, normal discharge   pH:    Cervix: closed and long   Adnexa: not evaluated   Bony Pelvis: android  System: Breast:  normal appearance, no masses or tenderness   Skin: normal coloration and turgor, no rashes    Neurologic: oriented, no focal deficits   Extremities: normal strength, tone, and muscle mass   HEENT extra ocular movement intact   Mouth/Teeth mucous membranes moist, pharynx normal without lesions   Neck supple and no masses   Cardiovascular: regular rate and rhythm   Respiratory:  chest clear, no  wheezing, crepitations, rhonchi, normal symmetric air entry   Abdomen: soft, gravid, NT   Urinary:       Assessment:    Pregnancy: G1P0 Patient Active Problem List  Diagnosis  . Pregnancy, supervision of first  . Insufficient prenatal care  . Diabetes mellitus, type 2  . Diabetes in pregnancy        Plan:     Initial labs drawn. Prenatal vitamins. Pap smear and cultures collected today Problem list reviewed and updated. Genetic Screening discussed : too late.  Ultrasound discussed; fetal survey: will order f/u ultrasound at 38 weeks. Will start twice weekly NST today Discussed risks associated with poorly controlled diabetes in pregnancy Will start diabetic teaching Will start glyburide 2.5 mg BID  Follow up in 1 weeks. 50% of 30 min visit spent on counseling and coordination of care.     Danicka Hourihan 05/08/2012  NST reviewed and reactive

## 2012-05-08 NOTE — Progress Notes (Signed)
Pulse 111 Edema trace in feet. C/o pain and pressure in pelvic area. C/o vaginal d/c as yellow with itch.

## 2012-05-09 ENCOUNTER — Encounter: Payer: Self-pay | Attending: Family Medicine | Admitting: Dietician

## 2012-05-09 DIAGNOSIS — Z713 Dietary counseling and surveillance: Secondary | ICD-10-CM | POA: Insufficient documentation

## 2012-05-09 DIAGNOSIS — O99810 Abnormal glucose complicating pregnancy: Secondary | ICD-10-CM | POA: Insufficient documentation

## 2012-05-10 NOTE — Addendum Note (Signed)
Addended by: Franchot Mimes on: 05/10/2012 02:17 PM   Modules accepted: Orders

## 2012-05-11 ENCOUNTER — Encounter: Payer: Self-pay | Admitting: Obstetrics and Gynecology

## 2012-05-11 ENCOUNTER — Ambulatory Visit (INDEPENDENT_AMBULATORY_CARE_PROVIDER_SITE_OTHER): Payer: Self-pay | Admitting: *Deleted

## 2012-05-11 VITALS — BP 128/84

## 2012-05-11 DIAGNOSIS — O24913 Unspecified diabetes mellitus in pregnancy, third trimester: Secondary | ICD-10-CM

## 2012-05-11 DIAGNOSIS — O24919 Unspecified diabetes mellitus in pregnancy, unspecified trimester: Secondary | ICD-10-CM

## 2012-05-11 DIAGNOSIS — O329XX0 Maternal care for malpresentation of fetus, unspecified, not applicable or unspecified: Secondary | ICD-10-CM | POA: Insufficient documentation

## 2012-05-11 LAB — CULTURE, OB URINE: Colony Count: 25000

## 2012-05-11 LAB — CULTURE, BETA STREP (GROUP B ONLY)

## 2012-05-11 LAB — CREATININE CLEARANCE, URINE, 24 HOUR
Creatinine Clearance: 163 mL/min — ABNORMAL HIGH (ref 75–115)
Creatinine, 24H Ur: 1196 mg/d (ref 700–1800)

## 2012-05-11 NOTE — Progress Notes (Signed)
P = 99   Baby in transverse lie, back down, head to maternal Lt.  Labor precautions discussed. Interpreter- Nettie Elm.

## 2012-05-11 NOTE — Progress Notes (Addendum)
NST performed today was reviewed and was found to be reactive. AFI 15.8 cm.  Continue recommended antenatal testing and prenatal care. If fetal malpresentation persists at next week's visit, offer external cephalic version and counsel about cesarean section in the event of failed version or as per patient preference if the malpresentation persists at 39 weeks or at time of labor if this occurs earlier than 39 weeks.

## 2012-05-15 ENCOUNTER — Encounter: Payer: Self-pay | Admitting: Dietician

## 2012-05-15 ENCOUNTER — Ambulatory Visit (INDEPENDENT_AMBULATORY_CARE_PROVIDER_SITE_OTHER): Payer: Self-pay | Admitting: Obstetrics & Gynecology

## 2012-05-15 ENCOUNTER — Encounter: Payer: Self-pay | Admitting: *Deleted

## 2012-05-15 ENCOUNTER — Telehealth: Payer: Self-pay | Admitting: *Deleted

## 2012-05-15 ENCOUNTER — Other Ambulatory Visit: Payer: Self-pay | Admitting: Obstetrics & Gynecology

## 2012-05-15 VITALS — BP 124/86 | Temp 97.2°F | Wt 166.4 lb

## 2012-05-15 DIAGNOSIS — E119 Type 2 diabetes mellitus without complications: Secondary | ICD-10-CM

## 2012-05-15 DIAGNOSIS — O0930 Supervision of pregnancy with insufficient antenatal care, unspecified trimester: Secondary | ICD-10-CM

## 2012-05-15 DIAGNOSIS — O24919 Unspecified diabetes mellitus in pregnancy, unspecified trimester: Secondary | ICD-10-CM

## 2012-05-15 DIAGNOSIS — O0933 Supervision of pregnancy with insufficient antenatal care, third trimester: Secondary | ICD-10-CM

## 2012-05-15 LAB — POCT URINALYSIS DIP (DEVICE)
Bilirubin Urine: NEGATIVE
Ketones, ur: 80 mg/dL — AB
Specific Gravity, Urine: 1.02 (ref 1.005–1.030)
pH: 5.5 (ref 5.0–8.0)

## 2012-05-15 MED ORDER — GLYBURIDE 2.5 MG PO TABS: 5.0000 mg | ORAL_TABLET | Freq: Two times a day (BID) | ORAL | Status: DC

## 2012-05-15 NOTE — Progress Notes (Signed)
Diabetes Education:  Assisted by [place] interpreter Raynelle Fanning.  Many blood glucose readings that are elevated.  Fasting: 114-125-84 mg  PC breakfast: 116-214 mg  PC Lunch: 97-264 mg  PC Dinner: 135-271.  Reminded that she is to eat 3 meals and 3 snacks.  Having the snacks will provide the appropriated nutrition and will decrease her hunger at mealtime.  Will plan to f/u next week.  Need her and baby glucose in normal range to prevent post-deliver hypoglycemia for the infant.  Maggie Dresden Ament, RN, RD, CDE

## 2012-05-15 NOTE — Telephone Encounter (Signed)
Called pt with [facility] interpreter # [phone]. I informed her that her [place] appt has been changed to 4/24 @ 3:15pm. She will need to arrive @ 3pm to ultrasound dept. She will come to clinic @ 4pm for her NST.  Pt agreed and voiced understanding.

## 2012-05-15 NOTE — Patient Instructions (Signed)
Trabajo de parto y parto normal (Normal Labor and Delivery) En primer lugar, su mdico debe estar seguro de que usted est en trabajo de parto. Algunos signos son:  Puede haber eliminado el "tapn mucoso" antes que comience el trabajo de parto. Se trata de una pequea cantidad de mucus con sangre.  Tiene contracciones uterinas regulares.  El tiempo entre las contracciones se acorta.  Las molestias y el dolor se hacen gradualmente ms intensos.  El dolor se ubica principalmente en la espalda.  Los dolores empeoran al caminar.  El cuello del tero (la apertura del tero se hace ms delgada, comienza a borrarse, y se abre (se dilata). Una vez que se encuentre en trabajo de parto y sea admitida en el hospital, el mdico har lo siguiente:  Un examen fsico completo.  Controlar sus signos vitales (presin arterial, pulso, temperatura y la frecuencia cardaca fetal).  Realizar un examen vaginal (usando un guante estril y lubricante para determinar:  La posicin (presentacin) del beb (ceflica [vertex] o nalgas primero).  El nivel (plano) de la cabeza del beb en el canal de parto.  El borramiento y dilatacin del cuello del tero.  Le rasurarn el vello pbico y le aplicarn una enema segn lo considere el mdico y las circunstancias.  Generalmente se coloca un monitor electrnico sobre el abdomen. El monitor sigue la duracin e intensidad de las contracciones, as como la frecuencia cardaca del beb.  Generalmente, el profesional inserta una va intravenosa en el brazo para administrarle agua azucarada. Esta es una medida de precaucin, de modo que puedan administrarle rpidamente medicamentos durante el trabajo de parto. EL TRABAJO DE PARTO Y PARTO NORMALES SE DIVIDEN EN 3 ETAPAS: Primera etapa Comienzan las contracciones regulares y el cuello comienza a borrarse y dilatarse. Esta etapa puede durar entre 3 y 15 horas. El final de la primera etapa se considera cuando el cuello  est borrado en un 100% y se ha dilatado 10 cm. Le administrarn analgsicos por:  Inyeccin (morfina, demerol, etc.).  Anestesia regional (espinal, caudal o epidural, anestsicos colocados en diferentes regiones de la columna vertebral). Podrn administrarle medicamentos para el dolor en la regin paracervical, que consiste en la aplicacin de un anestsico inyectable en cada uno de los lados del cuello del tero. La embarazada puede requerir un "parto natural" , es decir no recibir medicamentos o anestesia durante el trabajo de parto y el parto. Segunda etapa En este momento el beb baja a travs del canal de parto (vagina) y nace. Esto puede durar entre 1 y 4 horas. A medida que el beb asoma la cabeza por el canal de parto, podr sentir una sensacin similar a cuando mueve el intestino. Sentir el impulse de empujar con fuerza hasta que el nio salga. A medida que la cabecita baja, el mdico decidir si realiza una episiotoma (corte en el perineo y rea de la vagina) para evitar la ruptura de los tejidos). Luego del nacimiento del beb y la expulsin de la placenta, la episiotoma se sutura. En algunos casos se coloca a la madre una mscara con xido nitroso para facilitar la respiracin y aliviar el dolor. El final de la etapa 2 se produce cuando el beb ha salido completamente. Luego, cuando el cordn umbilical deja de pulsar, se pinza y se corta. Tercera etapa La tercera etapa comienza luego que el beb ha nacido y finaliza luego de la expulsin de la placenta. Generalmente esto lleva entre 5 y 30 minutos. Luego de la expulsin de la   placenta, le aplicarn un medicamento por va intravenosa para ayudar a contraer el tero y prevenir hemorragias. En la tercera etapa no hay dolor y generalmente no son necesarios los analgsicos. Si le han realizado una episiotoma, es el momento de repararla. Luego del parto, la mam es observada y controlada exhaustivamente durante 1  2 horas para verificar que no  hay sangrado en el post parto (hemorragias). Si pierde mucha sangre, le administrarn un medicamento para contraer el tero y detener la hemorragia. Document Released: 12/25/2007 Document Revised: 04/05/2011 Exit[employer] Patient Information 2013 [employer].  

## 2012-05-15 NOTE — Progress Notes (Signed)
U/S scheduled 05/17/12 at 1115 am.

## 2012-05-15 NOTE — Progress Notes (Signed)
FBS 84-125, 2 hr PP 135-271. Increase to 5 mg BID. Need f/u US for growth with uncertain dating

## 2012-05-15 NOTE — Progress Notes (Signed)
Patient states that Diabeta is not working to control her blood sugars.  Patient also c/o lower abdominal pain.

## 2012-05-17 ENCOUNTER — Ambulatory Visit (HOSPITAL_COMMUNITY): Payer: Self-pay

## 2012-05-18 ENCOUNTER — Ambulatory Visit (HOSPITAL_COMMUNITY)
Admission: RE | Admit: 2012-05-18 | Discharge: 2012-05-18 | Disposition: A | Discharge status: Home / Self Care | Payer: Medicaid Other | Source: Ambulatory Visit | Attending: Obstetrics & Gynecology | Admitting: Obstetrics & Gynecology

## 2012-05-18 ENCOUNTER — Ambulatory Visit (INDEPENDENT_AMBULATORY_CARE_PROVIDER_SITE_OTHER): Payer: Self-pay | Admitting: *Deleted

## 2012-05-18 ENCOUNTER — Inpatient Hospital Stay (HOSPITAL_COMMUNITY)
Admission: AD | Admit: 2012-05-18 | Discharge: 2012-05-21 | DRG: 774 | Disposition: A | Discharge status: Home / Self Care | Payer: Medicaid Other | Source: Ambulatory Visit | Attending: Obstetrics & Gynecology | Admitting: Obstetrics & Gynecology

## 2012-05-18 ENCOUNTER — Inpatient Hospital Stay (HOSPITAL_COMMUNITY): Payer: Medicaid Other | Admitting: Anesthesiology

## 2012-05-18 ENCOUNTER — Encounter (HOSPITAL_COMMUNITY): Payer: Self-pay | Admitting: *Deleted

## 2012-05-18 ENCOUNTER — Encounter (HOSPITAL_COMMUNITY): Payer: Self-pay | Admitting: Anesthesiology

## 2012-05-18 VITALS — BP 125/84

## 2012-05-18 DIAGNOSIS — IMO0001 Reserved for inherently not codable concepts without codable children: Secondary | ICD-10-CM

## 2012-05-18 DIAGNOSIS — E119 Type 2 diabetes mellitus without complications: Secondary | ICD-10-CM

## 2012-05-18 DIAGNOSIS — O2432 Unspecified pre-existing diabetes mellitus in childbirth: Secondary | ICD-10-CM | POA: Diagnosis present

## 2012-05-18 DIAGNOSIS — O329XX0 Maternal care for malpresentation of fetus, unspecified, not applicable or unspecified: Secondary | ICD-10-CM

## 2012-05-18 DIAGNOSIS — O0933 Supervision of pregnancy with insufficient antenatal care, third trimester: Secondary | ICD-10-CM

## 2012-05-18 DIAGNOSIS — O0930 Supervision of pregnancy with insufficient antenatal care, unspecified trimester: Secondary | ICD-10-CM | POA: Insufficient documentation

## 2012-05-18 DIAGNOSIS — O409XX0 Polyhydramnios, unspecified trimester, not applicable or unspecified: Secondary | ICD-10-CM | POA: Diagnosis present

## 2012-05-18 DIAGNOSIS — O24913 Unspecified diabetes mellitus in pregnancy, third trimester: Secondary | ICD-10-CM

## 2012-05-18 DIAGNOSIS — O3660X0 Maternal care for excessive fetal growth, unspecified trimester, not applicable or unspecified: Secondary | ICD-10-CM | POA: Diagnosis present

## 2012-05-18 DIAGNOSIS — O99810 Abnormal glucose complicating pregnancy: Secondary | ICD-10-CM | POA: Insufficient documentation

## 2012-05-18 DIAGNOSIS — O24919 Unspecified diabetes mellitus in pregnancy, unspecified trimester: Secondary | ICD-10-CM

## 2012-05-18 LAB — GLUCOSE, CAPILLARY: Glucose-Capillary: 106 mg/dL — ABNORMAL HIGH (ref 70–99)

## 2012-05-18 LAB — CBC
Hemoglobin: 11.9 g/dL — ABNORMAL LOW (ref 12.0–15.0)
RBC: 4.33 MIL/uL (ref 3.87–5.11)

## 2012-05-18 MED ORDER — OXYTOCIN 40 UNITS IN LACTATED RINGERS INFUSION - SIMPLE MED: 62.5000 mL/h | INTRAVENOUS | Status: DC

## 2012-05-18 MED ORDER — PHENYLEPHRINE 40 MCG/ML (10ML) SYRINGE FOR IV PUSH (FOR BLOOD PRESSURE SUPPORT)
80.0000 ug | PREFILLED_SYRINGE | INTRAVENOUS | Status: DC | PRN
  Filled 2012-05-18: qty 2

## 2012-05-18 MED ORDER — OXYTOCIN 40 UNITS IN LACTATED RINGERS INFUSION - SIMPLE MED
INTRAVENOUS | Status: AC
  Filled 2012-05-18: qty 1000

## 2012-05-18 MED ORDER — LIDOCAINE HCL (PF) 1 % IJ SOLN
INTRAMUSCULAR | Status: AC
  Filled 2012-05-18: qty 30

## 2012-05-18 MED ORDER — FLEET ENEMA 7-19 GM/118ML RE ENEM: 1.0000 | ENEMA | RECTAL | Status: DC | PRN

## 2012-05-18 MED ORDER — ONDANSETRON HCL 4 MG/2ML IJ SOLN
4.0000 mg | Freq: Four times a day (QID) | INTRAMUSCULAR | Status: DC | PRN
  Administered 2012-05-18: 4 mg via INTRAVENOUS
  Filled 2012-05-18: qty 2

## 2012-05-18 MED ORDER — EPHEDRINE 5 MG/ML INJ
10.0000 mg | INTRAVENOUS | Status: DC | PRN
  Filled 2012-05-18: qty 4
  Filled 2012-05-18: qty 2

## 2012-05-18 MED ORDER — LACTATED RINGERS IV SOLN: 500.0000 mL | INTRAVENOUS | Status: DC | PRN

## 2012-05-18 MED ORDER — PHENYLEPHRINE 40 MCG/ML (10ML) SYRINGE FOR IV PUSH (FOR BLOOD PRESSURE SUPPORT)
80.0000 ug | PREFILLED_SYRINGE | INTRAVENOUS | Status: DC | PRN
  Filled 2012-05-18: qty 5
  Filled 2012-05-18: qty 2

## 2012-05-18 MED ORDER — FENTANYL 2.5 MCG/ML BUPIVACAINE 1/10 % EPIDURAL INFUSION (WH - ANES)
INTRAMUSCULAR | Status: DC | PRN
  Administered 2012-05-18: 14 mL/h via EPIDURAL

## 2012-05-18 MED ORDER — CITRIC ACID-SODIUM CITRATE 334-500 MG/5ML PO SOLN: 30.0000 mL | ORAL | Status: DC | PRN

## 2012-05-18 MED ORDER — OXYCODONE-ACETAMINOPHEN 5-325 MG PO TABS: 1.0000 | ORAL_TABLET | ORAL | Status: DC | PRN

## 2012-05-18 MED ORDER — LIDOCAINE HCL (PF) 1 % IJ SOLN
30.0000 mL | INTRAMUSCULAR | Status: DC | PRN
  Filled 2012-05-18: qty 30

## 2012-05-18 MED ORDER — LACTATED RINGERS IV SOLN
INTRAVENOUS | Status: DC
  Administered 2012-05-18: 22:00:00 via INTRAVENOUS

## 2012-05-18 MED ORDER — FENTANYL 2.5 MCG/ML BUPIVACAINE 1/10 % EPIDURAL INFUSION (WH - ANES)
14.0000 mL/h | INTRAMUSCULAR | Status: DC | PRN
  Filled 2012-05-18: qty 125

## 2012-05-18 MED ORDER — EPHEDRINE 5 MG/ML INJ
10.0000 mg | INTRAVENOUS | Status: DC | PRN
  Filled 2012-05-18: qty 2

## 2012-05-18 MED ORDER — LACTATED RINGERS IV SOLN: 500.0000 mL | Freq: Once | INTRAVENOUS | Status: DC

## 2012-05-18 MED ORDER — IBUPROFEN 600 MG PO TABS: 600.0000 mg | ORAL_TABLET | Freq: Four times a day (QID) | ORAL | Status: DC | PRN

## 2012-05-18 MED ORDER — FENTANYL CITRATE 0.05 MG/ML IJ SOLN
50.0000 ug | INTRAMUSCULAR | Status: DC | PRN
  Administered 2012-05-18: 50 ug via INTRAVENOUS
  Filled 2012-05-18: qty 2

## 2012-05-18 MED ORDER — LIDOCAINE HCL (PF) 1 % IJ SOLN
INTRAMUSCULAR | Status: DC | PRN
  Administered 2012-05-18 (×2): 8 mL

## 2012-05-18 MED ORDER — ACETAMINOPHEN 325 MG PO TABS: 650.0000 mg | ORAL_TABLET | ORAL | Status: DC | PRN

## 2012-05-18 MED ORDER — DIPHENHYDRAMINE HCL 50 MG/ML IJ SOLN: 12.5000 mg | INTRAMUSCULAR | Status: DC | PRN

## 2012-05-18 MED ORDER — OXYTOCIN BOLUS FROM INFUSION: 500.0000 mL | INTRAVENOUS | Status: DC

## 2012-05-18 NOTE — Progress Notes (Signed)
P = 95  [place] for growth today- results discussed w/Dr. Debroah Loop. He came to clinic to see pt and discuss pregnancy dating as well as [place] results. Pt states that she had regular periods prior to pregnancy, had some prenatal care in [place] and was told her EDD was 5/13. Her doctor in [place] told her that because of her diabetes, she would be delivered 1 week early on 05/30/12. Plan of care is to continue fetal testing twice weekly and IOL on 05/30/12.  Pt voiced understanding of all information given.

## 2012-05-18 NOTE — Progress Notes (Signed)
NST 05/18/12 reactive. [place] polyhydramnios and LGA baby. EDC 5/13 by sure LMP and early 2nd trimester US done in [place] per patient. F/U next week, plan delivery at 39 weeks

## 2012-05-18 NOTE — Anesthesia Preprocedure Evaluation (Signed)
Anesthesia Evaluation  Patient identified by MRN, date of birth, ID band Patient awake    Reviewed: Allergy & Precautions, H&P , NPO status , Patient's Chart, lab work & pertinent test results  Airway Mallampati: II TM Distance: >3 FB Neck ROM: full    Dental no notable dental hx.    Pulmonary neg pulmonary ROS,    Pulmonary exam normal       Cardiovascular negative cardio ROS      Neuro/Psych negative neurological ROS  negative psych ROS   GI/Hepatic negative GI ROS, Neg liver ROS,   Endo/Other    Renal/GU negative Renal ROS  negative genitourinary   Musculoskeletal negative musculoskeletal ROS (+)   Abdominal Normal abdominal exam  (+)   Peds negative pediatric ROS (+)  Hematology negative hematology ROS (+)   Anesthesia Other Findings   Reproductive/Obstetrics (+) Pregnancy                           Anesthesia Physical Anesthesia Plan  ASA: II  Anesthesia Plan: Epidural   Post-op Pain Management:    Induction:   Airway Management Planned:   Additional Equipment:   Intra-op Plan:   Post-operative Plan:   Informed Consent: I have reviewed the patients History and Physical, chart, labs and discussed the procedure including the risks, benefits and alternatives for the proposed anesthesia with the patient or authorized representative who has indicated his/her understanding and acceptance.     Plan Discussed with:   Anesthesia Plan Comments:         Anesthesia Quick Evaluation

## 2012-05-18 NOTE — Progress Notes (Signed)
   [name] is a 27 y.o. G1P0 at [redacted]w[redacted]d  admitted for active labor  Subjective: Wants epidural Objective: BP 109/69  Pulse 98  Temp(Src) 98 F (36.7 C) (Oral)  Resp 18  Ht 5\' 4"  (1.626 m)  Wt 79.379 kg (175 lb)  BMI 30.02 kg/m2  LMP 08/31/2011    FHT:  FHR: 140 bpm, variability: moderate,  accelerations:  Present,  decelerations:  Absent UC:   regular, every 1 minutes SVE:   Dilation: 7.5 Effacement (%): 90 Station: Ballotable Exam by:: GPayne, RN Moderate amount of blood show.  Very little relaxation of uterus between contractions Labs: Lab Results  Component Value Date   WBC 10.9* 05/18/2012   HGB 11.9* 05/18/2012   HCT 35.1* 05/18/2012   MCV 81.1 05/18/2012   PLT 251 05/18/2012    Assessment / Plan: Spontaneous labor, progressing normally Waiting on epidural Dr. Debroah Loop given update of status of tetanic contractions/stable FHR.  Will try to get epidural asap and continuous EFM Labor: Progressing normally Fetal Wellbeing:  Category I Pain Control:  Fentanyl Anticipated MOD:  NSVD  [name],[name] 05/18/2012, 11:19 PM

## 2012-05-18 NOTE — H&P (Signed)
[name] is a 27 y.o. female G1P0 with IUP at [redacted]w[redacted]d presenting for labor. Pt states she has been having regular, every 2 minutes contractions, associated with less flow than a normal period vaginal bleeding.  Membranes are intact, with active fetal movement.   [facility]  Prenatal History/Complications: Class B diabetes Late Care Polyhydramnios LGA (9 lb 5 oz) Past Medical History: Past Medical History  Diagnosis Date  . Diabetes mellitus without complication     states she was dx in [place]; no records avail    Past Surgical History: Past Surgical History  Procedure Laterality Date  . No past surgeries      Obstetrical History: OB History   Grav Para Term Preterm Abortions TAB SAB Ect Mult Living   1               Social History: History   Social History  . Marital Status: Single    Spouse Name: N/A    Number of Children: N/A  . Years of Education: N/A   Social History Main Topics  . Smoking status: Never Smoker   . Smokeless tobacco: Never Used  . Alcohol Use: No  . Drug Use: No  . Sexually Active: None   Other Topics Concern  . None   Social History Narrative  . None    Family History: Family History  Problem Relation Age of Onset  . Hypertension Mother     Allergies: Not on File  Prescriptions prior to admission  Medication Sig Dispense Refill  . glyBURIDE (DIABETA) 2.5 MG tablet Take 2 tablets (5 mg total) by mouth 2 (two) times daily with a meal.  60 tablet  3  . Prenatal Multivit-Min-Fe-FA (PRENATAL VITAMINS) 0.8 MG tablet Take 1 tablet by mouth daily.  30 tablet  12     Review of Systems   Constitutional: Negative for fever, chills, weight loss, malaise/fatigue and diaphoresis.  HENT: Negative for hearing loss, ear pain, nosebleeds, congestion, sore throat, neck pain, tinnitus and ear discharge.   Eyes: Negative for blurred vision, double vision, photophobia, pain, discharge and redness.  Respiratory: Negative  for cough, hemoptysis, sputum production, shortness of breath, wheezing and stridor.   Cardiovascular: Negative for chest pain, palpitations, orthopnea,  leg swelling  Gastrointestinal: Positive for abdominal pain. Negative for heartburn, nausea, vomiting, diarrhea, constipation, blood in stool Genitourinary: Negative for dysuria, urgency, frequency, hematuria and flank pain.  Musculoskeletal: Negative for myalgias, back pain, joint pain and falls.  Skin: Negative for itching and rash.  Neurological: Negative for dizziness, tingling, tremors, sensory change, speech change, focal weakness, seizures, loss of consciousness, weakness and headaches.  Endo/Heme/Allergies: Negative for environmental allergies and polydipsia. Does not bruise/bleed easily.      Blood pressure 158/101, pulse 84, temperature 98 F (36.7 C), temperature source Oral, resp. rate 22, last menstrual period 08/31/2011. General appearance: alert, cooperative and mild distress Lungs: clear to auscultation bilaterally Heart: regular rate and rhythm Abdomen: soft, non-tender; bowel sounds normal Pelvic: mod amount bloody show Extremities: Homans sign is negative, no sign of DVT DTR's 2+ Presentation: cephalic Fetal monitoringBaseline: 130 bpm, Variability: Good {> 6 bpm), Accelerations: Reactive and Decelerations: Absent Uterine activity strong contractions q 2 minutes  Dilation: 6 Effacement (%): 90 Station: -3 Exam by:: FRAN, CNM- DR RIGBY   Prenatal labs: ABO, Rh: O/POS/-- (04/01 1018) Antibody: NEG (04/01 1018) Rubella:  immune RPR: NON REAC (04/01 1018)  HBsAg: NEGATIVE (04/01 1018)  HIV: NON REACTIVE (04/01  1018)  GBS: Negative (04/21 0000)  1 hr Glucola 324 Genetic screening  Too late Anatomy US normal    Assessment: [name] is a 27 y.o. G1P0 with an IUP at [redacted]w[redacted]d presenting for active labor  Plan: Expectant management   [name],Edrian Melucci 05/18/2012, 9:37 PM

## 2012-05-18 NOTE — Anesthesia Procedure Notes (Signed)
Epidural Patient location during procedure: OB Start time: 05/18/2012 10:52 PM End time: 05/18/2012 10:56 PM  Staffing Anesthesiologist: Sandrea Hughs Performed by: anesthesiologist   Preanesthetic Checklist Completed: patient identified, site marked, surgical consent, pre-op evaluation, timeout performed, IV checked, risks and benefits discussed and monitors and equipment checked  Epidural Patient position: sitting Prep: site prepped and draped and DuraPrep Patient monitoring: continuous pulse ox and blood pressure Approach: midline Injection technique: LOR air  Needle:  Needle type: Tuohy  Needle gauge: 17 G Needle length: 9 cm and 9 Needle insertion depth: 5 cm cm Catheter type: closed end flexible Catheter size: 19 Gauge Catheter at skin depth: 10 cm Test dose: negative and Other  Assessment Sensory level: T9 Events: blood not aspirated, injection not painful, no injection resistance, negative IV test and no paresthesia  Additional Notes Reason for block:procedure for pain

## 2012-05-18 NOTE — MAU Note (Signed)
Pt reports having ctx on and off since Monday. Got worse tonight reports some bloody show no SROM. Good fetal movement reported.

## 2012-05-19 DIAGNOSIS — E119 Type 2 diabetes mellitus without complications: Secondary | ICD-10-CM

## 2012-05-19 DIAGNOSIS — O409XX0 Polyhydramnios, unspecified trimester, not applicable or unspecified: Secondary | ICD-10-CM

## 2012-05-19 DIAGNOSIS — O2432 Unspecified pre-existing diabetes mellitus in childbirth: Secondary | ICD-10-CM

## 2012-05-19 LAB — TYPE AND SCREEN
ABO/RH(D): O POS
Antibody Screen: NEGATIVE

## 2012-05-19 LAB — RPR: RPR Ser Ql: NONREACTIVE

## 2012-05-19 LAB — GLUCOSE, CAPILLARY: Glucose-Capillary: 120 mg/dL — ABNORMAL HIGH (ref 70–99)

## 2012-05-19 MED ORDER — ZOLPIDEM TARTRATE 5 MG PO TABS: 5.0000 mg | ORAL_TABLET | Freq: Every evening | ORAL | Status: DC | PRN

## 2012-05-19 MED ORDER — SENNOSIDES-DOCUSATE SODIUM 8.6-50 MG PO TABS
2.0000 | ORAL_TABLET | Freq: Every day | ORAL | Status: DC
  Administered 2012-05-19 – 2012-05-20 (×2): 2 via ORAL

## 2012-05-19 MED ORDER — DIBUCAINE 1 % RE OINT: 1.0000 "application " | TOPICAL_OINTMENT | RECTAL | Status: DC | PRN

## 2012-05-19 MED ORDER — PNEUMOCOCCAL VAC POLYVALENT 25 MCG/0.5ML IJ INJ: 0.5000 mL | INJECTION | Freq: Once | INTRAMUSCULAR | Status: DC

## 2012-05-19 MED ORDER — ONDANSETRON HCL 4 MG PO TABS: 4.0000 mg | ORAL_TABLET | ORAL | Status: DC | PRN

## 2012-05-19 MED ORDER — DIPHENHYDRAMINE HCL 25 MG PO CAPS: 25.0000 mg | ORAL_CAPSULE | Freq: Four times a day (QID) | ORAL | Status: DC | PRN

## 2012-05-19 MED ORDER — PNEUMOCOCCAL VAC POLYVALENT 25 MCG/0.5ML IJ INJ
0.5000 mL | INJECTION | INTRAMUSCULAR | Status: AC
  Administered 2012-05-20: 0.5 mL via INTRAMUSCULAR
  Filled 2012-05-19: qty 0.5

## 2012-05-19 MED ORDER — MISOPROSTOL 200 MCG PO TABS
ORAL_TABLET | ORAL | Status: AC
  Filled 2012-05-19: qty 1

## 2012-05-19 MED ORDER — TETANUS-DIPHTH-ACELL PERTUSSIS 5-2.5-18.5 LF-MCG/0.5 IM SUSP
0.5000 mL | Freq: Once | INTRAMUSCULAR | Status: AC
  Administered 2012-05-20: 0.5 mL via INTRAMUSCULAR

## 2012-05-19 MED ORDER — LANOLIN HYDROUS EX OINT: TOPICAL_OINTMENT | CUTANEOUS | Status: DC | PRN

## 2012-05-19 MED ORDER — MISOPROSTOL 200 MCG PO TABS
ORAL_TABLET | ORAL | Status: AC
  Administered 2012-05-19: 800 ug
  Filled 2012-05-19: qty 4

## 2012-05-19 MED ORDER — SIMETHICONE 80 MG PO CHEW: 80.0000 mg | CHEWABLE_TABLET | ORAL | Status: DC | PRN

## 2012-05-19 MED ORDER — OXYCODONE-ACETAMINOPHEN 5-325 MG PO TABS
1.0000 | ORAL_TABLET | ORAL | Status: DC | PRN
Start: 2012-05-19 — End: 2012-05-21

## 2012-05-19 MED ORDER — IBUPROFEN 600 MG PO TABS
600.0000 mg | ORAL_TABLET | Freq: Four times a day (QID) | ORAL | Status: DC
  Administered 2012-05-19 – 2012-05-21 (×8): 600 mg via ORAL
  Filled 2012-05-19 (×8): qty 1

## 2012-05-19 MED ORDER — PRENATAL MULTIVITAMIN CH
1.0000 | ORAL_TABLET | Freq: Every day | ORAL | Status: DC
  Administered 2012-05-19 – 2012-05-20 (×2): 1 via ORAL
  Filled 2012-05-19 (×2): qty 1

## 2012-05-19 MED ORDER — METFORMIN HCL 500 MG PO TABS
500.0000 mg | ORAL_TABLET | Freq: Two times a day (BID) | ORAL | Status: DC
  Administered 2012-05-19 – 2012-05-21 (×4): 500 mg via ORAL
  Filled 2012-05-19 (×4): qty 1

## 2012-05-19 MED ORDER — ONDANSETRON HCL 4 MG/2ML IJ SOLN: 4.0000 mg | INTRAMUSCULAR | Status: DC | PRN

## 2012-05-19 MED ORDER — WITCH HAZEL-GLYCERIN EX PADS: 1.0000 "application " | MEDICATED_PAD | CUTANEOUS | Status: DC | PRN

## 2012-05-19 MED ORDER — BENZOCAINE-MENTHOL 20-0.5 % EX AERO
1.0000 "application " | INHALATION_SPRAY | CUTANEOUS | Status: DC | PRN
  Filled 2012-05-19: qty 56

## 2012-05-19 NOTE — Progress Notes (Signed)
Called back to pt room approximately 1.5 hours after delivery for heavy bleeding with fundal massage.  Pt's bleeding had initially slowed but developed frequent episodes of gushing of blood with fundal massage.   On exam pt is VSS and firm uterus.  With Fundal massage, significant bleeding noted.  Pt with residual epidural anesthesia.  Explored uterine cavity with sterile glove and was able to remove 3 very large clots.  800mg  cytotec pr placed.  Bleeding significantly improved.  Overall additional blood loss estimated @ 300cc with total of 600cc for delivery.  Pt to be observed for additional hour and transferred out to post-partum for routine care if bleeding is controlled.  Discussed with Chilton Si CNM  Andrena Mews, DO [facility] Resident - PGY-2 05/19/2012 4:17 AM

## 2012-05-19 NOTE — Anesthesia Postprocedure Evaluation (Signed)
  Anesthesia Post-op Note  Patient: [name]  Procedure(s) Performed: * No procedures listed *  Patient Location: PACU and Mother/Baby  Anesthesia Type:Epidural  Level of Consciousness: awake, alert  and oriented  Airway and Oxygen Therapy: Patient Spontanous Breathing  Post-op Pain: none  Post-op Assessment: Post-op Vital signs reviewed, Patient's Cardiovascular Status Stable, No headache, No backache, No residual numbness and No residual motor weakness  Post-op Vital Signs: Reviewed and stable  Complications: No apparent anesthesia complications

## 2012-05-19 NOTE — Progress Notes (Signed)
   [name] is a 27 y.o. G1P0 at [redacted]w[redacted]d  admitted for active labor  Subjective: Comfortable with epidural Objective: BP 106/49  Pulse 81  Temp(Src) 98 F (36.7 C) (Oral)  Resp 18  Ht 5\' 4"  (1.626 m)  Wt 79.379 kg (175 lb)  BMI 30.02 kg/m2  LMP 08/31/2011    FHT:  FHR: 140 bpm, variability: moderate,  accelerations:  Present,  decelerations:  Absent UC:   regular, every 2 minutes SVE:   Dilation: 8.5 Effacement (%): 100 Station: Ballotable Exam by:: Christy.  Small amount of blood show.  Lab Results  Component Value Date   WBC 10.9* 05/18/2012   HGB 11.9* 05/18/2012   HCT 35.1* 05/18/2012   MCV 81.1 05/18/2012   PLT 251 05/18/2012    Assessment / Plan: Spontaneous labor, progressing normally Although it is conserning that vtx has not descended; baby LGA (9# 5 oz efw) Labor: Progressing normally Fetal Wellbeing:  Category I Pain Control:  epidural Anticipated MOD:  NSVD  [name],Dima Mini 05/19/2012, 1:17 AM

## 2012-05-20 ENCOUNTER — Encounter (HOSPITAL_COMMUNITY): Payer: Self-pay | Admitting: *Deleted

## 2012-05-20 LAB — CBC
Platelets: 185 10*3/uL (ref 150–400)
RDW: 14.5 % (ref 11.5–15.5)
WBC: 8.9 10*3/uL (ref 4.0–10.5)

## 2012-05-20 NOTE — Progress Notes (Signed)
Post Partum Day 1 Subjective: Eating, drinking, voiding, ambulating well.  +flatus.  Lochia and pain wnl.  Denies dizziness, lightheadedness, sob. No complaints.  Pumping and taking colostrum to NICU- baby in NICU for glycemic control. Was on oral diabetic med (4 pills/d) prior to pregnancy in [place], continued during pregnancy- just moved to [place] towards end of pregnancy- changed to glyburide  Objective: Blood pressure 104/69, pulse 67, temperature 97.9 F (36.6 C), temperature source Oral, resp. rate 16, height 5\' 4"  (1.626 m), weight 79.379 kg (175 lb), last menstrual period 08/31/2011, SpO2 98.00%.  Physical Exam:  General: alert, cooperative and no distress Lochia: appropriate Uterine Fundus: firm Incision: n/a DVT Evaluation: No evidence of DVT seen on physical exam. Negative Homan's sign. No cords or calf tenderness. No significant calf/ankle edema.   Recent Labs  05/18/12 2150 05/20/12 0533  HGB 11.9* 8.6*  HCT 35.1* 26.1*    Assessment/Plan: Plan for discharge tomorrow, Breastfeeding, Lactation consult and Contraception undecided Declines circumcision   LOS: 2 days   Marge Duncans 05/20/2012, 12:12 PM

## 2012-05-21 MED ORDER — METFORMIN HCL 500 MG PO TABS: 500.0000 mg | ORAL_TABLET | Freq: Two times a day (BID) | ORAL | Status: DC

## 2012-05-21 MED ORDER — IBUPROFEN 600 MG PO TABS: 600.0000 mg | ORAL_TABLET | Freq: Four times a day (QID) | ORAL | Status: DC

## 2012-05-21 NOTE — Discharge Summary (Addendum)
Obstetric Discharge Summary Reason for Admission: onset of labor Prenatal Procedures: none Intrapartum Procedures: spontaneous vaginal delivery Postpartum Procedures: none Complications-Operative and Postpartum: none Hemoglobin  Date Value Range Status  05/20/2012 8.6* 12.0 - 15.0 g/dL Final     REPEATED TO VERIFY     DELTA CHECK NOTED     HCT  Date Value Range Status  05/20/2012 26.1* 36.0 - 46.0 % Final    Physical Exam:  General: alert Lochia: appropriate Uterine Fundus: firm Incision: n/a DVT Evaluation: No evidence of DVT seen on physical exam.  FBS 144 today Discharge Diagnoses: Term Pregnancy-delivered, A2 DM  Discharge Information: Date: 05/21/2012 Activity: pelvic rest Diet: routine Medications: Ibuprofen and metformin 500 mg BID, Micronor daily Condition: stable Instructions: refer to practice specific booklet Discharge to: home Follow-up Information   Follow up with [facility] In 6 weeks. (for postpartum visit)    Contact information:   [address] [place] [address] [phone]      Newborn Data: Live born female  Birth Weight: 8 lb 14.9 oz (4051 g) APGAR: 9, 9  Home with NICU for gylcemic control.  Tyge Somers C. 05/21/2012, 7:10 AM

## 2012-05-21 NOTE — Progress Notes (Signed)
Discharge instructions reviewed with patient and significant other per interpreter.  Both state understanding of home care, signs/symptoms to report to MD, medications, activity and return MD office visit.  Patient given manual breast pump for home use and instruction on use.  Patient ambulated in stable condition with staff for discharge without incident.

## 2012-05-21 NOTE — Progress Notes (Addendum)
Patient ID: [name], female   DOB: [birth year], 27 y.o.   MRN: [id number] PPD #2  Subjective:  Eating, drinking, voiding, ambulating well. +flatus. Lochia and pain wnl. Denies dizziness, lightheadedness, sob. No complaints.  Pumping and taking colostrum to NICU- baby in NICU for glycemic control.  Was on oral diabetic med (4 pills/d) prior to pregnancy in [place], continued during pregnancy- just moved to [place] towards end of pregnancy- changed to glyburide  Objective:  Blood pressure 104/69, pulse 67, temperature 97.9 F (36.6 C), temperature source Oral, resp. rate 16, height 5\' 4"  (1.626 m), weight 79.379 kg (175 lb), last menstrual period 08/31/2011, SpO2 98.00%.  Physical Exam:  General: alert, cooperative and no distress  Lochia: appropriate  Uterine Fundus: firm  Incision: n/a  DVT Evaluation: No evidence of DVT seen on physical exam.  Negative Homan's sign.  No cords or calf tenderness.  No significant calf/ankle edema.   Recent Labs   05/18/12 2150  05/20/12 0533   HGB  11.9*  8.6*   HCT  35.1*  26.1*    Assessment/Plan: PPD #2 - stable.  Discharge home on metformin BID. POP

## 2012-05-21 NOTE — Clinical Social Work Note (Signed)
Clinical Social Work Department PSYCHOSOCIAL ASSESSMENT - MATERNAL/CHILD 05/21/2012  Patient:  [name],[name]  Account Number:  [id number]  Admit Date:  05/19/2012  Childs Name:   [name]    Clinical Social Worker:  Adreona Brand, LCSW   Date/Time:  05/21/2012 11:00 AM  Date Referred:  05/21/2012   Referral source  Physician  RN     Referred reason  NICU   Other referral source:    I:  FAMILY / HOME ENVIRONMENT Child's legal guardian:  PARENT  Guardian - Name Guardian - Age Guardian - Address  [name] 26 [address] [place], [place] [address]  [name]  [address] [place], [place] [address]   Other household support members/support persons Name Relationship DOB  staying with a friend    12yo    9yo     Other support:   MOB and FOB report good suppor from their friend and cousin that stays in area    II  PSYCHOSOCIAL DATA Information Source:  Patient Interview  Financial and Community Resources Employment:   MOB unemployed, FOB contract work on and off   Financial resources:  Medicaid If Medicaid - County:  [place] Other  WIC   School / Grade:   Maternity Care Coordinator / Child Services Coordination / Early Interventions:  Cultural issues impacting care:   MOB does not speak english, FOB limited english, both just moved from [place] 3 weeks ago.    III  STRENGTHS Strengths  Adequate Resources  Home prepared for Child (including basic supplies)  Supportive family/friends  Compliance with medical plan  Understanding of illness   Strength comment:    IV  RISK FACTORS AND CURRENT PROBLEMS Current Problem:  None   Risk Factor & Current Problem Patient Issue Family Issue Risk Factor / Current Problem Comment   N N     V  SOCIAL WORK ASSESSMENT CSW spoke with MOB and FOB while in MOB's room and with interpretor.  CSW discussed infant admission to NICU and understanding of illness.  MOB and FOB report understanding and  good communication with nurses and doctors.  MOB and FOB report appropriate emotion response to NICU admission. CSW discussed PPD symptoms and MOB reported she knew what to look out for.  MOB and FOB  live with their friend that has two other children in the home. MOB and FOB reported they just recently moved from [place] 3 weeks ago and are staying with a friend until they can find a place of their own.  CSW discussed any concerns with supplies.  MOB and FOB report no concerns with supplies at this time. CSW discussed medicaid and any concerns.  MOB reports Reyna with financial counselor is helping coordinate medicaid. CSW discussed continued NICU stay and SW support.  CSW instructed MOB and FOB to let CSW know if any concerns arise.  CSW will continue to follow while infant is in NICU.      VI SOCIAL WORK PLAN Social Work Plan  Psychosocial Support/Ongoing Assessment of Needs   Type of pt/family education:   PPD symptoms   If child protective services report - county:   If child protective services report - date:   Information/referral to community resources comment:   Other social work plan:    

## 2012-05-22 ENCOUNTER — Other Ambulatory Visit: Payer: Self-pay

## 2012-05-22 NOTE — Progress Notes (Signed)
Post discharge review completed. 

## 2012-05-25 ENCOUNTER — Other Ambulatory Visit: Payer: Self-pay

## 2012-06-15 ENCOUNTER — Ambulatory Visit: Payer: Self-pay | Admitting: Obstetrics & Gynecology

## 2013-11-26 ENCOUNTER — Encounter (HOSPITAL_COMMUNITY): Payer: Self-pay | Admitting: *Deleted

## 2014-01-25 NOTE — L&D Delivery Note (Signed)
Patient is 29 y.o. G2P1001 [redacted]w[redacted]d admitted for IOL 2/2 to A2GDM, hx of preexisting DM (on oral agent only)   Delivery Note At 1:36 AM a viable female was delivered via Vaginal, Spontaneous Delivery (Presentation: ; Occiput Anterior).  APGAR: 7, 9; weight 8 lb 14.5 oz (4040 g).   Placenta status: Intact, Spontaneous.  Cord: 3 vessels with the following complications: None.  Cord pH: Na  Anesthesia: Epidural  Episiotomy: None Lacerations: 1st degree Suture Repair: Not repaired, hemostatic Est. Blood Loss (mL): 454 Patient with greater than expected bleeding. Gave PP pitocin and Cytotec rectally and swept LUS for clot and alleviated approximately 100cc of clot.   Mom to postpartum.  Baby to Couplet care / Skin to Skin.  Isa Rankin Hollie Wojahn 09/03/2014, 2:28 AM

## 2014-04-01 ENCOUNTER — Encounter

## 2014-04-01 ENCOUNTER — Ambulatory Visit: Admit: 2014-04-01 | Attending: Perinatal

## 2014-04-01 ENCOUNTER — Ambulatory Visit

## 2014-04-01 ENCOUNTER — Ambulatory Visit: Admit: 2014-04-01 | Attending: Maternal & Fetal Medicine

## 2014-04-01 DIAGNOSIS — O24112 Pre-existing diabetes mellitus, type 2, in pregnancy, second trimester: Secondary | ICD-10-CM

## 2014-04-01 DIAGNOSIS — O24312 Unspecified pre-existing diabetes mellitus in pregnancy, second trimester: Secondary | ICD-10-CM

## 2014-04-01 MED ORDER — glucagon, human recombinant, (GLUCAGON) 1 mg Kit
1 | PACK | INTRAMUSCULAR | 0 refills | Status: AC | PRN
Start: 2014-04-01 — End: ?
  Filled 2014-04-01: qty 1, 30d supply, fill #0

## 2014-04-01 MED ORDER — insulin aspart (NOVOLOG) 100 unit/mL injection
100 | Freq: Every day | SUBCUTANEOUS | 0 refills | Status: AC
Start: 2014-04-01 — End: 2014-04-18
  Filled 2014-04-01: qty 10, 28d supply, fill #0

## 2014-04-01 MED ORDER — lancets 28 gauge Misc
28 | 5 refills | Status: AC
Start: 2014-04-01 — End: 2014-06-27
  Filled 2014-04-01: qty 200, 28d supply, fill #0

## 2014-04-01 MED ORDER — insulin syringe-needle U-100 1/2 mL 31 x 5/16" Syrg
0.5 | 6 refills | Status: AC
Start: 2014-04-01 — End: 2014-05-16
  Filled 2014-04-01: qty 10, 28d supply, fill #0

## 2014-04-01 MED ORDER — blood sugar diagnostic Strp
ORAL_STRIP | 5 refills | 30.00000 days | Status: AC
Start: 2014-04-01 — End: 2014-06-27
  Filled 2014-04-01: qty 200, 28d supply, fill #0

## 2014-04-01 MED ORDER — insulin NPH (NOVOLIN N) 100 unit/mL injection
100 | Freq: Every day | SUBCUTANEOUS | 0 refills | 29.00000 days | Status: AC
Start: 2014-04-01 — End: 2014-04-18
  Filled 2014-04-01: qty 120, 30d supply, fill #0

## 2014-04-01 MED FILL — INSULIN SYRINGE U-100 WITH NEEDLE 0.5 ML 29 GAUGE X 1/2": 0.5 0.5 mL 29 gauge x 1/2" | 30 days supply | Qty: 120 | Fill #0

## 2014-04-01 NOTE — Unmapped (Signed)
DAPP Education:  Lori Jacobs is 29 yo with H/O T2DM @[redacted]w[redacted]d  gestational age, here today for a DAPP initial visit.  Today this RN met with patient to review recommendations of the Diabetes and Pregnancy Program at [facility]. Patient is Spanish speaking only, medical Interpreter present for education session. The patient was attentive and open to education.   Patient is currently taking Metformin and Glimepiride, instructed patient to discontinue taking these medications. Patient was testing BG 7x a day prior to appointment. Blood Glucose Medication Record reviewed by Bryson Dames, NP and this RN. Patient to start on split/dose insulin. Instructed patient to pick-up insulin at the pharmacy and begin taking.     Needs Based On:   Learning Assessment:  Level of Education: Unknown  Adequate reading skill: Yes, Spanish only  Barriers to Learning: Language  Learning needs identified by patient: administering insulin    Educational Handouts:  Blood Glucose medication record in spanish     Reducing Risks:  Verbalizes the type of diabetes, Verbalizes effects of diabetes on pregnancy, Verbalizes effects of pregnancy on diabetes (Type 1 & 2), Insulin need during pregnancy (handout provided), Verbalizes effects of diabetes during pregnancy on fetus & newborn, Verbalizes the beneftis of breastfeeding, Agrees to perform daily kick counts twice daily (beginning @ 28 weeks) and Understands potential maternal/fetal complications    Healthy Coping:  Adequate support systems: yes  Who is your support system: Partner  Referred to Social Services: no    Problem Solving:  Verbalizes who to call (resource numbers), Verbalizes when to call, Verbalizes understanding of hyperglycemia, Verbalizes how and when to treat hyperglycemia, Verbalizes understanding of hypoglycemia and Verbalizes how and when to treat hypoglycemia      Plan/Recommendations:   1. Patient to test sugars 7x/day and call in if 2 or more >200 in 24 hours or below 50  2. Patient to  D/C Metformin and Glimepiride. Patient to begin taking split/dose insulin   3.  Reviewed content and timing of meals and the importance when taking insulin  4. Encouraged patient to bring blood glucose log sheet and 24 hr food record to next appointment        The following content was covered today for split/mixed education:  1.  Reviewed s/s of hyperglycemia. Reviewed the risks of hyperglycemia for both mother and baby.   2. Reviewed Log & NPH insulin; the action, side effects, onset, peak, duration of each and the storage of these insulins.  Also discussed how both insulins should look - one cloudy, one clear.     3. Reviewed injection sites and importance of rotating sites.  4. Directions given for frequency of BG testing. Reviewed target BG range. Blood glucose record provided. Reviewed how to fax or MyChart blood glucose records for insulin dose adjustments.   5. Patient able to give a return demonstration on how to draw up Log and NPH insulin into the syringe. Patient able to give return demonstration of proper steps for administering insulin via vial/syringe.   6. Reviewed signs and symptoms of hypoglycemia. Instructions given for treating and preventing hypoglycemia.    7. Instructed on when and how to administer Glucagon.  Instructed on how to mix, draw up, and administer Glucagon.  Described the proper actions to take once Glucagon has been administered.  Reviewed proper storage of Glucagon.  Instructed patient on the importance of teaching family members and friends how to administer Glucagon.     Prescriptions pended for provider to sign.    Patient  verbalized understanding of all information discussed. No questions or concerns at this time.     Total face to face time: 1 hour      Aquilla Hacker, RN, BSN, CDE
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
## 2014-04-01 NOTE — Unmapped (Signed)
DAPP Education:  Research officer, trade union present. 28yo at [redacted]w[redacted]d gestational age with a dx of T2DM on Metformin/Glimepiride.  Patient here today for DAPP RD initial visit. Patient states she has been diagnosed for 3 years. She has met with an RD before, but it has been about 2 years.   Patient usually eats 39meals/day and 2snacks/day.  Patient reports that she is not following any kind of diabetes meal plan and has just started to check her sugars 7x/day.     24 HOUR DIET RECALL:    MORNING: Coffee, 2 eggs, 1 slice toast   TIME: 9:30am    SNACK: no snack  TIME:    NOON: Sandwich (ham, avocado, cheese, water)  TIME: 1 pm    SNACK: apple  TIME:    EVENING:  2 tortilla, beans, rice, meat, chicken  TIME: 6pm    SNACK: banana, orange  TIME:    Allergies/Food Sensitivities: NKFA    Pre-Pregnancy Weight: Unknown  Pre-Pregnancy BMI: Unknown    Exercise Pattern (time,day,duration,type): patient states that she walks 6-7 days/week for 60 min    Primary Dietary Issues: patient consumes fruit at HS, pt not portioning carbohydrate foods    Educational Handouts: spanish education booklet, spanish personal meal plan    Plan/Recommendations:  1. Begin 2nd trimester meal plan (30,15,45,15,45,15)  2. Encouraged physical activity  3. Reviewed blood sugar logs and times to test  4. Reviewed high protein + carbohydrate snacks   5. Discussed portion sizes of foods   6. Provided patient with measuring cups       RD to follow up in 1 week.  Harden Mo, MS, RD, LD

## 2014-04-02 ENCOUNTER — Encounter

## 2014-04-02 NOTE — Unmapped (Signed)
Patient called with questions regarding Insulin dose and where to Inject Insulin. Reviewed correct sites to self administer insulin. Reviewed Insulin instructions with patient. All questions answered and patient verbalized an understanding

## 2014-04-03 NOTE — Unmapped (Signed)
Diabetes and Pregnancy Program     TELEPHONE NOTE     Spanish Interpreter [phone] used during telephone call    Phone: [phone] (home) [phone] (work)  GA: [redacted]w[redacted]d   Pt called/Returned call to pt.    PREGNANCY CONCERNS     Patient reported slight pain in her vagina. Patient denied contractions, discharge or vaginal bleeding. Instructed patient to make appointment with her OBGYN tomorrow, provided phone number. Patient in agreement with plan.     GLYCEMIC CONTROL                           Number of hypoglycemia events: 1 Patient reported having one BG of 49 mg/dl, drank juice and BG was 79 mg/dl 15 minutes later  Hypoglycemia precautions reinforced.      diabetes medication regimen verified as above.  Reviewed insulin doses and when to take NPH/Log insulin. Reviewed the importance of following the meal plan. Reviewed target BG range. Reviewed the importance of never skipping meals/snacks as this can cause hypoglycemia     Reviewed next appointment and the importance of bringing blood glucose medication record to appt.     REFILLS     No refill needs    Plan/Recommendations:   1. Patient to test sugars 7x/day and call in if 2 or more >200 in 24 hours or below 50   2. Reviewed high protein + carbohydrate snacks   4. Reviewed content and timing of meals   5. Reviewed snack and meal examples   6. Encouraged patient to bring blood glucose log sheet and 24 hr food record to next appointment. Encouraged patient to call sooner with patterns of high/ow BGs. Patient verbalized understanding.       Patient verbalized understanding of all information discussed.   No questions or concerns at this time.   Total call time : 25  minutes   ?   Aquilla Hacker, RN, BSN, CDE

## 2014-04-05 NOTE — Unmapped (Addendum)
F/U phone call with assistance of [employer].  Pt reports that she understands the use of insulin but the BGs are more elevated with the insulin.    DIABETES AND PREGNANCY TELEPHONE NOTE     Phone: [phone] (home) [phone] (work)  GA: [redacted]w[redacted]d   F/U phone call to check on pt progress.  Pt reports that her BGs are higher now that she is taking insulina.      PREGNANCY CONCERNS     No obstetric complaints reported.     GLYCEMIC CONTROL                            Bkft Lunch Dinner Bedtime    3  am FBG 1 hr  PC  Bkft AC  lunch 1 hr  PC  lunch AC  dinner 1 hr  PC  dinner AC  HS  snack NPH  Log  Glyb  Acarb Log  Glyb  Acarb NPH  Log  Glyb  Acarb NPH  Glyb   Date         Dose Dose Dose Dose            17NPH   6NPH   3/9  123 154 79 156 86 118 92       3/10  90 176 76 187 110 78 92 6Log  6Log    3/11  101 204 89                            Number of hypoglycemia events: None     Insulin regimen verified as above.      DIETARY RECALL/PHYSICAL ACTIVITY     Following prescribed cc pregnancy meal plan.  Not exercising.  Encouraged pt to walk 30 minutes after dinner for 30 minutes to improve insulin sensitivity.    NEW INSULIN  REGIMEN     Inst pt to increase insulin regimen to:  20N/8Log ac breakfast, 6Log ac dinner and 8N ac HS snack.  Pt verbalizes understanding.  Pt agrees to make adjustments to medication regimen and new regimen verified with patient with help of [employer].  F/U for insulin educational reinforcement with DAPP APN 04/08/14.    REFILLS     No refill requests    Inquired about 24 hour collection.  Pt reports that no one gave her the 24 urine collection kit.  Pt agrees to get collection kit when she comes for DAPP visit on Monday.            Kathyrn Sheriff. Beverlee Nims, MS, ACNS, RNC-OB  Clinical Program Coordinator  [facility]  Division of Maternal-Fetal Medicine  Office Phone:  [phone]

## 2014-04-08 ENCOUNTER — Other Ambulatory Visit: Admit: 2014-04-08

## 2014-04-08 ENCOUNTER — Ambulatory Visit: Admit: 2014-04-08 | Attending: Adult Health

## 2014-04-08 DIAGNOSIS — O24112 Pre-existing diabetes mellitus, type 2, in pregnancy, second trimester: Secondary | ICD-10-CM

## 2014-04-08 LAB — COMPREHENSIVE METABOLIC PANEL
ALT: 18 U/L (ref 7–52)
AST: 13 U/L (ref 13–39)
Albumin: 3.5 g/dL (ref 3.5–5.7)
Alkaline Phosphatase: 62 U/L (ref 36–125)
Anion Gap: 7 mmol/L (ref 3–16)
BUN: 10 mg/dL (ref 7–25)
CO2: 26 mmol/L (ref 21–33)
Calcium: 9.3 mg/dL (ref 8.6–10.3)
Chloride: 101 mmol/L (ref 98–110)
Creatinine: 0.44 mg/dL — ABNORMAL LOW (ref 0.60–1.30)
Glucose: 106 mg/dL — ABNORMAL HIGH (ref 70–100)
Osmolality, Calculated: 277 mosm/kg — ABNORMAL LOW (ref 278–305)
Potassium: 4.1 mmol/L (ref 3.5–5.3)
Sodium: 134 mmol/L (ref 133–146)
Total Bilirubin: 0.2 mg/dL (ref 0.0–1.5)
Total Protein: 6.1 g/dL — ABNORMAL LOW (ref 6.4–8.9)
eGFR AA CKD-EPI: >90 See note.
eGFR NONAA CKD-EPI: >90 See note.

## 2014-04-08 LAB — MATERNAL SCREEN 4
AFP MOM: 1.23
AFP Results: NEGATIVE
AFP: 44.9 ng/mL
DIA Mom Value: 0.72
DIA Value (EIA): 137.28 pg/mL
DSR (Second Trimester) 1 IN: 10000
DSR (by Age) 1N: 639
Estriol Mom: 1.21
Estriol: 1.72 ng/mL
Gestational Age: 18 WEEKS
MSHCG Mom: 0.67
MSHCG: 19430 m[IU]/mL
Maternal Weight: 140 [lb_av]
OSBR Risk: 1769
T18 (By Age): 1:2489 {titer}

## 2014-04-08 LAB — CBC
Hematocrit: 37 % (ref 35.0–45.0)
Hemoglobin: 12 g/dL (ref 11.7–15.5)
MCH: 26.3 pg — ABNORMAL LOW (ref 27.0–33.0)
MCHC: 32.5 g/dL (ref 32.0–36.0)
MCV: 81.1 fL (ref 80.0–100.0)
MPV: 8.2 fL (ref 7.5–11.5)
Platelets: 355 10E3/uL (ref 140–400)
RBC: 4.56 10E6/uL (ref 3.80–5.10)
RDW: 18.6 % — ABNORMAL HIGH (ref 11.0–15.0)
WBC: 12.2 10E3/uL — ABNORMAL HIGH (ref 3.8–10.8)

## 2014-04-08 LAB — T4, FREE: Free T4: 0.71 ng/dL (ref 0.61–1.76)

## 2014-04-08 LAB — POC GLU MONITORING DEVICE: POC Glucose Monitoring Device: 99 mg/dL (ref 70–100)

## 2014-04-08 LAB — TSH: TSH: 0.87 u[IU]/mL (ref 0.34–5.60)
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
## 2014-04-08 NOTE — Unmapped (Signed)
F/U with BG and med record 04/18/14. Kathyrn Sheriff. Renika Shiflet, MS, ACNS, RNC-OB, CPT

## 2014-04-11 ENCOUNTER — Encounter

## 2014-04-12 NOTE — Unmapped (Signed)
obgyn ultrasound billing only

## 2014-04-16 NOTE — Unmapped (Signed)
Diabetes and Pregnancy Program     TELEPHONE NOTE     Phone: [phone] (home) [phone] (work)  GA: [redacted]w[redacted]d   Patient called reporting she had a low BG. This RN spoke with patient using Spanish Medical Interpreter ID [phone]    PREGNANCY CONCERNS     Denies obstetric complaints.     GLYCEMIC CONTROL                           Patient reported she had a low BG of 56 mg/dl in the morning time around 10 am.   Number of hypoglycemia events: 1  Hypoglycemia precautions reinforced. Patient reported treating with 4 oz of apple juice and retesting BG 15 minutes later and BG was 64 mg/dl      diabetes medication regimen verified: NPH 19/8, LOG 8/6    DIETARY RECALL/PHYSICAL ACTIVITY     Patient reported she is following the meal plan as prescribed. Patient denied any questions at this time.     NEW DIABETES MEDICATION  REGIMEN   Reviewed proper insulin storage.     REFILLS     No refill needs at this time.     Reminded patient of appointment this Thursday 3/24. Reminded patient to provide BG record and meter at every visit. Patient denied any other questions or concerns at this time.     Aquilla Hacker, RN, BSN, CDE

## 2014-04-18 ENCOUNTER — Other Ambulatory Visit: Admit: 2014-04-18

## 2014-04-18 ENCOUNTER — Ambulatory Visit: Admit: 2014-04-18 | Discharge: 2014-04-18

## 2014-04-18 ENCOUNTER — Ambulatory Visit: Admit: 2014-04-18

## 2014-04-18 DIAGNOSIS — Z3689 Encounter for other specified antenatal screening: Secondary | ICD-10-CM

## 2014-04-18 DIAGNOSIS — O24112 Pre-existing diabetes mellitus, type 2, in pregnancy, second trimester: Secondary | ICD-10-CM

## 2014-04-18 DIAGNOSIS — O0992 Supervision of high risk pregnancy, unspecified, second trimester: Secondary | ICD-10-CM

## 2014-04-18 LAB — PROTEIN, URINE, 24 HOUR
Creatinine, 24 Hour Urine: 0.96 g/24 hr (ref 0.80–1.80)
Creatinine, Urine: 30 mg/dL
Protein, Ur: <4 mg/dL
Urine Volume: 3200 mL

## 2014-04-18 LAB — ANTIBODY SCREEN: Antibody Screen: NEGATIVE

## 2014-04-18 LAB — HEMOGLOBINOPATHY EVALUATION
Hgb A2 Quant: 2.7 % (ref 0.0–3.5)
Hgb A: 97.3 % (ref 96.0–99.0)

## 2014-04-18 LAB — HPV HIGH RISK WITH GENOTYPING
HPV DNA High Risk Oth: NEGATIVE
HPV DNA High Risk: NEGATIVE
HPV Genotype 16: NEGATIVE
HPV Genotype 18: NEGATIVE

## 2014-04-18 LAB — ABO/RH: Rh Type: POSITIVE

## 2014-04-18 LAB — CBC
Hematocrit: 36.3 % (ref 35.0–45.0)
Hemoglobin: 12.2 g/dL (ref 11.7–15.5)
MCH: 27.4 pg (ref 27.0–33.0)
MCHC: 33.7 g/dL (ref 32.0–36.0)
MCV: 81.2 fL (ref 80.0–100.0)
MPV: 8.1 fL (ref 7.5–11.5)
Platelets: 303 10*3/uL (ref 140–400)
RBC: 4.47 10*6/uL (ref 3.80–5.10)
RDW: 18.2 % (ref 11.0–15.0)
WBC: 12.3 10*3/uL (ref 3.8–10.8)

## 2014-04-18 LAB — POC GLU MONITORING DEVICE: POC Glucose Monitoring Device: 95 mg/dL (ref 70–100)

## 2014-04-18 LAB — TREPONEMA PALLIDUM AB WITH REFLEX: Treponema Pallidum: NEGATIVE

## 2014-04-18 LAB — HIV 1+2 ANTIBODY/ANTIGEN WITH REFLEX: HIV 1+2 AB/AGN: NONREACTIVE

## 2014-04-18 LAB — CREATININE, URINE, 24 HOUR
Creatinine, 24 Hour Urine: 0.96 g/24 hr (ref 0.80–1.80)
Creatinine, Urine: 30 mg/dL
Urine Volume: 3200 mL

## 2014-04-18 LAB — HEPATITIS B SURFACE ANTIGEN: Hep B Surface Ag: NONREACTIVE

## 2014-04-18 LAB — CHLAMYDIA/GONORRHOEAE DNA THIN PREP
Chlamydia Trachomatis DNA: NEGATIVE
Neisseria Gonorrhoeae DNA: NEGATIVE

## 2014-04-18 LAB — RUBELLA IMMUNE STATUS
RUB NUM: 4.6 {index} — ABNORMAL HIGH (ref 0.0–0.8)
Rubella IgG Scr: POSITIVE

## 2014-04-18 MED ORDER — insulin aspart (NOVOLOG) 100 unit/mL injection
100 | SUBCUTANEOUS | 2.00 refills | 30.00000 days | Status: AC
Start: 2014-04-18 — End: 2014-06-27

## 2014-04-18 MED ORDER — insulin NPH (NOVOLIN N) 100 unit/mL injection
100 | SUBCUTANEOUS | 2.00 refills | 29.00000 days | Status: AC
Start: 2014-04-18 — End: 2014-06-27

## 2014-04-18 NOTE — Unmapped (Signed)
RN exit  Assisted by [email or web address].  Reviewed and updated checklist,answered all questions.  Scheduled appointment for Echo and EKG 05/03/14 0930 and 1045.  RTC to see N. Lintner in 2 weeks.  Assisted to lab.  Janeice Stegall RN

## 2014-04-18 NOTE — Unmapped (Signed)
RN Note  Pt here with 24-hr Urine collection.  Lab refused specimen due to Language communication.  RN called [place] interpreter called for assistance  [place] ID [phone]  Lab accepted Specimen  Paulla Dolly RN
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
## 2014-04-19 NOTE — Unmapped (Signed)
OB/GYN U/S billing only

## 2014-04-22 MED ORDER — insulin NPH (HUMULIN N) 100 unit/mL injection
100 | Freq: Every morning | SUBCUTANEOUS | 0 refills | 29.00000 days | Status: AC
Start: 2014-04-22 — End: 2014-05-16
  Filled 2014-04-23: qty 10, 28d supply, fill #0

## 2014-04-22 MED ORDER — insulin aspart (NOVOLOG) 100 unit/mL injection
100 | SUBCUTANEOUS | 0 refills | Status: AC
Start: 2014-04-22 — End: 2014-05-16
  Filled 2014-04-23: qty 10, 28d supply, fill #0

## 2014-04-22 NOTE — Unmapped (Signed)
MFM Attending    I did not see or examine the patient. She is being seen today for her Class B Diabetes at [redacted]w[redacted]d. I discussed her with the resident/fellow and agree with the plan as noted. She is currently on SDI and her control is excellent. Today we recommended no changes to her regimen. Dr. Doylene Canning performed her initial OB exam and ordered all remaining OB labs.  She is otherwise doing well and anatomy ultrasound normal today. She will return in 2 weeks to glycemic management and 4 weeks to DAPP.    Maryruth Hancock, MD

## 2014-05-03 ENCOUNTER — Inpatient Hospital Stay: Admit: 2014-05-03

## 2014-05-03 ENCOUNTER — Institutional Professional Consult (permissible substitution): Admit: 2014-05-03

## 2014-05-03 DIAGNOSIS — O24112 Pre-existing diabetes mellitus, type 2, in pregnancy, second trimester: Secondary | ICD-10-CM

## 2014-05-03 LAB — ECHOCARDIOGRAM 2D WO COLOR DOPPLER COMPLETE: Left Ventricular Ejection Fraction: 58

## 2014-05-03 MED FILL — INSULIN SYRINGE U-100 WITH NEEDLE 0.5 ML 31 GAUGE X 5/16": 0.5 0.5 mL 31 gauge x 5/16" | 30 days supply | Qty: 120 | Fill #1

## 2014-05-03 NOTE — Unmapped (Signed)
Patient here for EKG ordered by Dr.  Gregery Na, MD  Phone #  [phone]  Fax #  [phone] .

## 2014-05-06 ENCOUNTER — Ambulatory Visit: Admit: 2014-05-06 | Attending: Adult Health

## 2014-05-06 ENCOUNTER — Other Ambulatory Visit: Admit: 2014-05-06

## 2014-05-06 ENCOUNTER — Ambulatory Visit: Admit: 2014-05-06

## 2014-05-06 DIAGNOSIS — R350 Frequency of micturition: Secondary | ICD-10-CM

## 2014-05-06 DIAGNOSIS — O0992 Supervision of high risk pregnancy, unspecified, second trimester: Secondary | ICD-10-CM

## 2014-05-06 LAB — URINALYSIS MACROSCOPIC ONLY
Bilirubin, UA: NEGATIVE
Blood, UA: NEGATIVE
Glucose, UA: NEGATIVE mg/dL
Ketones, UA: NEGATIVE mg/dL
Leukocytes, UA: NEGATIVE
Nitrite, UA: NEGATIVE
Protein, UA: NEGATIVE mg/dL
Specific Gravity, UA: 1.02 (ref 1.005–1.035)
Urobilinogen, UA: 0.2 EU/dL (ref 0.2–1.0)
pH, UA: 6 (ref 5.0–8.0)

## 2014-05-06 LAB — URINE CULTURE

## 2014-05-06 LAB — POC GLU MONITORING DEVICE: POC Glucose Monitoring Device: 70 mg/dL (ref 70–100)

## 2014-05-06 MED FILL — LANCETS 28 GAUGE: 28 28 gauge | 25 days supply | Qty: 200 | Fill #0

## 2014-05-06 MED FILL — BLOOD SUGAR DIAGNOSTIC STRIPS: 25 days supply | Qty: 200 | Fill #1
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
## 2014-05-06 NOTE — Unmapped (Signed)
ROB visit today  RTC 1 week with DAPP  Lori Jacobs

## 2014-05-06 NOTE — Unmapped (Signed)
Pt here after MFM diabetes visit to have urine checked  Interpreter on line for visit  Pt reported to N. Litner at diabetes visit that she was having urinary urgency/frequency    Sent here to have urine checked  When asked pt what kind of symptoms she was having, she said she wasn't sure why she got sent here  UA done and negative  Urine culture ordered considering pt hasn't had done yet this pregnancy    -Pt c/o what sounds like sciatic pain down legs- informed this can be normal in pregnancy, discussed stretching  -Also c/o increased pain in vagina when walking    RTO 05/16/14 with DAPP

## 2014-05-07 NOTE — Unmapped (Signed)
DAPP Follow-up:  29yo G5P4 at [redacted]w[redacted]d gestational age with a dx of T2DM on SDI.  Patient here today for DAPP RD follow up visit.  Patient complains of her leg hurting and urinary urgency. Patient reports that she usually eats 61meals/day and 3snacks/day.  Patient reports that the diabetes meal plan is going well but that she feels low after breakfast and in the middle of the night.      Filed Vitals:    05/06/14 1334   BP: 118/70   Pulse: 71     Lab Results   Component Value Date    HGBA1C 5.4 09/24/2010       Height: 4' 10 (147.3 cm)   Wt Readings from Last 10 Encounters:   05/06/14 153 lb 6.4 oz (69.582 kg)   05/06/14 153 lb 8 oz (69.627 kg)   04/18/14 147 lb 14.4 oz (67.087 kg)   04/08/14 142 lb 11.2 oz (64.728 kg)   04/01/14 140 lb 11.2 oz (63.821 kg)   09/21/10 143 lb 14.4 oz (65.273 kg)     Medication Regimen for Diabetes and other medications:   Current Outpatient Prescriptions on File Prior to Visit   Medication Sig Dispense Refill   ??? blood sugar diagnostic Strp Use as directed to test blood glucose 7 times a day and as needed. 200 strip 5   ??? acetaminophen (TYLENOL) 325 MG tablet Take 650 mg by mouth every 6 hours as needed. Take 2 tablets every 4-6 hours as need for headache not to exceed 4000 mg per day      ??? ferrous sulfate 325 (65 FE) MG tablet Take 325 mg by mouth daily with breakfast.     ??? glucagon, human recombinant, (GLUCAGON) 1 mg Kit Inject 1 kit as directed as needed. 1 kit 0   ??? insulin aspart (NOVOLOG) 100 unit/mL injection Inject 8 units with breakfast and 6 units with dinner 10 mL 0   ??? insulin aspart (NOVOLOG) 100 unit/mL injection Inject 8 Units subcutaneously with breakfast and 6 units with dinner. 10 mL 0   ??? insulin NPH (HUMULIN N) 100 unit/mL injection Inject 19 Units subcutaneously every morning and 8 units at bedtime. 10 mL 0   ??? insulin NPH (NOVOLIN N) 100 unit/mL injection Inject 19 units in the morning and 8 units at night. 10 mL 0   ??? insulin syringe-needle U-100 1/2 mL 31 x  5/16 Syrg Use to inject insulin 200 each 6   ??? lancets 28 gauge Misc Use as directed to test blood glucose 7 times a day and as needed. 200 each 5   ??? PNV NO.27/IRON CARB & FUM/FA (PNV NO.27-IRON CARBON & FUM-FA ORAL) Take 1 capsule by mouth.     ??? senna (SENOKOT) 8.6 mg tablet Take 1 tablet by mouth daily. Take 1 tablet by mouth twice a day        No current facility-administered medications on file prior to visit.     24 HOUR DIET RECALL/REVIEW:    MORNING: 2 eggs/ 1/2 slice bread, black coffee  TIME:8-9am    SNACK: apple/cheese  TIME: 10am    NOON: beef, beans or rice, glass of milk, sometimes a diet soda  TIME: 12pm     SNACK: orange/cheese  TIME: 2-3pm    EVENING: [employer] (hamburger/fries(states she goes about 2-3days/week) will make spaghetti or tortillas, beans, rice, chicken at home   TIME: 5-6pm    SNACK: 1/2 banana   TIME: 10pm     Exercise  Pattern (time,day,duration,type): patient reports she is trying to walk everyday for at least 10 min     Primary Dietary Issues/Adherence to Meal Plan: patient consuming ~ 3 servings of fruit/day, RD recommended patient to not eat any fruit before lunch time or at HS snack     Educational Handouts Provided at Visit: DAPP individual meal plan    Plan/Recommendations:  1. Continue 2nd trimester meal plan (30,15,45,15,45,15-30), Reviewed the importance of timing of meals (8am, 10am, 12pm, 3pm, 6pm 10pm).   2. Encouraged physical activity (30 minutes daily after dinner)  3. Reviewed blood sugar logs and times to test (fasting in AM, 1 hour after breakfast, before lunch, 1 hour after lunch, before dinner, 1 hour after dinner, before bedtime snack). Reviewed blood sugar ranges for pregnancy (60-90 preprandial and 90-120 1 hr postprandial)   4. Reviewed high protein + carbohydrate snacks (15 grams of carbohydrate + protein) and provided patient with examples.   5. Encouraged 28gm/day dietary fiber intake     Harden Mo, MS, RD, LD

## 2014-05-16 ENCOUNTER — Other Ambulatory Visit: Admit: 2014-05-16

## 2014-05-16 ENCOUNTER — Ambulatory Visit: Admit: 2014-05-16 | Discharge: 2014-05-16

## 2014-05-16 DIAGNOSIS — O0992 Supervision of high risk pregnancy, unspecified, second trimester: Secondary | ICD-10-CM

## 2014-05-16 LAB — URINALYSIS-MACROSCOPIC W/REFLEX TO MICROSCOPIC
Bilirubin, UA: NEGATIVE
Blood, UA: NEGATIVE
Glucose, UA: NEGATIVE mg/dL
Ketones, UA: NEGATIVE mg/dL
Leukocytes, UA: NEGATIVE
Nitrite, UA: NEGATIVE
Protein, UA: NEGATIVE mg/dL
Specific Gravity, UA: 1.015 (ref 1.005–1.035)
Urobilinogen, UA: 0.2 EU/dL (ref 0.2–1.0)
pH, UA: 5.5 (ref 5.0–8.0)

## 2014-05-16 LAB — POC GLU MONITORING DEVICE: POC Glucose Monitoring Device: 96 mg/dL (ref 70–100)

## 2014-05-16 MED ORDER — insulin aspart (NOVOLOG) 100 unit/mL injection
100 | SUBCUTANEOUS | 0 refills | Status: AC
Start: 2014-05-16 — End: 2014-06-27

## 2014-05-16 MED ORDER — insulin aspart (NOVOLOG) 100 unit/mL injection
100 | SUBCUTANEOUS | 2.00 refills | 30.00000 days | Status: AC
Start: 2014-05-16 — End: 2014-05-16
  Filled 2014-05-16: qty 10, 28d supply, fill #0

## 2014-05-16 MED ORDER — insulin NPH (HUMULIN N) 100 unit/mL injection
100 | SUBCUTANEOUS | 0 refills | 29.00000 days | Status: AC
Start: 2014-05-16 — End: 2014-06-27

## 2014-05-16 MED ORDER — insulin NPH (HUMULIN N) 100 unit/mL injection
100 | SUBCUTANEOUS | 2.00 refills | 29.00000 days | Status: AC
Start: 2014-05-16 — End: 2014-05-16
  Filled 2014-05-16: qty 10, 28d supply, fill #0

## 2014-05-16 MED ORDER — insulin syringe-needle U-100 0.5 mL 31 gauge x 5/16 Syrg
0.5 | 6 refills | Status: AC
Start: 2014-05-16 — End: 2014-06-27
  Filled 2014-05-16: qty 100, 25d supply, fill #0
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
## 2014-05-17 NOTE — Unmapped (Signed)
Met with pt for the first time this pregnancy. All communication done with [employer].  Pt here for ROB visit. Explained my role as case manager to pt and provided pt with my contact information. Discussed WIC program with patient, pt declined and stated she does not need WIC. Explained SUIDS to patient and assessed patient's current understanding of it. Explained prevalence of SUIDS and infant mortality in [place]. Patient given hand out on reducing risk SUIDS. Discussed pt's preference for feeding infant. Pt desires to breastfeed infant. Pt educated on health benefits of breastfeeding for infant and mother.  Discussed importance of placing infant skin to skin on chest after delivery in order to help regulate infant breathing, heart rate and temperature.  Pt stated she had problems breastfeeding her last child due to inverted nipples. Will schedule pt with lactation at a later time.  Spoke with patient today about establishing care with pediatrician for infant. Reviewed timing of pediatric appointments during the first year of life for infant, including the importance of having infant seen for initial pediatric visit following discharge from hospital. At this time, patient is planning on taking infant to: [facility].  Reviewed meaning of medical home with patient and the importance of establishing care with a primary care physician. Pt stated her PCP is also [facility].  Reviewed signs and symptoms of preterm labor with patient.  Instructed patient to contact the [facility] or go directly to triage for s/s of PTL.   Patient provided with Signs of Preterm Labor pamphlet.  Pt desires BTL for contraception. Informed pt of cost of BTL that will have to paid before delivery. Pt unaware of cost and will have to discuss with FOB. Pt denies any needs at present.  Pt scheduled for ROB visit. Encouraged pt to call with any questions or concerns.  Rebbecca Osuna J Star Resler, RNC-OB,BSN
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
## 2014-05-30 ENCOUNTER — Encounter: Attending: Clinical

## 2014-05-30 ENCOUNTER — Ambulatory Visit: Admit: 2014-05-30 | Attending: Perinatal

## 2014-05-30 ENCOUNTER — Other Ambulatory Visit: Admit: 2014-05-30

## 2014-05-30 DIAGNOSIS — O0992 Supervision of high risk pregnancy, unspecified, second trimester: Secondary | ICD-10-CM

## 2014-05-30 DIAGNOSIS — O24112 Pre-existing diabetes mellitus, type 2, in pregnancy, second trimester: Secondary | ICD-10-CM

## 2014-05-30 LAB — URINALYSIS-MACROSCOPIC W/REFLEX TO MICROSCOPIC
Bilirubin, UA: NEGATIVE
Blood, UA: NEGATIVE
Glucose, UA: NEGATIVE mg/dL
Ketones, UA: NEGATIVE mg/dL
Leukocytes, UA: NEGATIVE
Nitrite, UA: NEGATIVE
Protein, UA: NEGATIVE mg/dL
Specific Gravity, UA: 1.015 (ref 1.005–1.035)
Urobilinogen, UA: 0.2 EU/dL (ref 0.2–1.0)
pH, UA: 6 (ref 5.0–8.0)

## 2014-05-30 LAB — POC GLU MONITORING DEVICE: POC Glucose Monitoring Device: 91 mg/dL (ref 70–100)
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
## 2014-05-30 NOTE — Unmapped (Signed)
Schedule fetal echo  Follow up 2 weeks

## 2014-05-31 NOTE — Unmapped (Signed)
Met with pt. All communication done with live interpreter.  ROB and fetal echo scheduled for 06/13/14, pt aware. All questions answered. Encouraged pt to call with questions or concerns.  Lori Jacobs, RNC-OB,BSN

## 2014-06-13 ENCOUNTER — Encounter

## 2014-06-13 ENCOUNTER — Ambulatory Visit

## 2014-06-17 ENCOUNTER — Telehealth: Payer: Self-pay | Admitting: *Deleted

## 2014-06-17 NOTE — Telephone Encounter (Addendum)
Dois DavenportSandra left a message on voicemail in Spanish stating she wants results of test and call back.   5/24  1345  [facility] # [phone]. The person who answered stated that she is not [name] and there is no one by that name at this number.  Diane Day RNC.

## 2014-06-18 NOTE — Telephone Encounter (Addendum)
Called phone number left on message with interpreter Albertina SenegalMarly Adams and was told this is not number for Anita AxeSandra Cruz Vargas; but person at this phone number did call clinic. Correct name obtained and notes written in separate encounter in appropriate chart.

## 2014-06-27 ENCOUNTER — Ambulatory Visit: Admit: 2014-06-27 | Attending: Pediatric Cardiology

## 2014-06-27 ENCOUNTER — Other Ambulatory Visit: Admit: 2014-06-27

## 2014-06-27 ENCOUNTER — Ambulatory Visit: Admit: 2014-06-27 | Discharge: 2014-06-27

## 2014-06-27 DIAGNOSIS — O0992 Supervision of high risk pregnancy, unspecified, second trimester: Secondary | ICD-10-CM

## 2014-06-27 DIAGNOSIS — O24913 Unspecified diabetes mellitus in pregnancy, third trimester: Secondary | ICD-10-CM

## 2014-06-27 DIAGNOSIS — O24113 Pre-existing diabetes mellitus, type 2, in pregnancy, third trimester: Secondary | ICD-10-CM

## 2014-06-27 LAB — URINALYSIS-MACROSCOPIC W/REFLEX TO MICROSCOPIC
Bilirubin, UA: NEGATIVE
Blood, UA: NEGATIVE
Glucose, UA: NEGATIVE mg/dL
Ketones, UA: NEGATIVE mg/dL
Leukocytes, UA: NEGATIVE
Nitrite, UA: NEGATIVE
Protein, UA: NEGATIVE mg/dL
Specific Gravity, UA: 1.01 (ref 1.005–1.035)
Urobilinogen, UA: 0.2 EU/dL (ref 0.2–1.0)
pH, UA: 5.5 (ref 5.0–8.0)

## 2014-06-27 LAB — CBC
Hematocrit: 38.9 % (ref 35.0–45.0)
Hemoglobin: 13.2 g/dL (ref 11.7–15.5)
MCH: 29.3 pg (ref 27.0–33.0)
MCHC: 33.9 g/dL (ref 32.0–36.0)
MCV: 86.5 fL (ref 80.0–100.0)
MPV: 8.6 fL (ref 7.5–11.5)
Platelets: 240 10*3/uL (ref 140–400)
RBC: 4.5 10*6/uL (ref 3.80–5.10)
RDW: 14.5 % (ref 11.0–15.0)
WBC: 12.1 10*3/uL (ref 3.8–10.8)

## 2014-06-27 LAB — TREPONEMA PALLIDUM AB WITH REFLEX: Treponema Pallidum: NEGATIVE

## 2014-06-27 LAB — HEPATITIS B SURFACE ANTIGEN: Hep B Surface Ag: NONREACTIVE

## 2014-06-27 LAB — POC GLU MONITORING DEVICE: POC Glucose Monitoring Device: 120 mg/dL (ref 70–100)

## 2014-06-27 MED ORDER — blood sugar diagnostic Strp
ORAL_STRIP | 5 refills | 30.00000 days | Status: AC
Start: 2014-06-27 — End: 2014-07-18
  Filled 2014-06-27: qty 200, 29d supply, fill #2

## 2014-06-27 MED ORDER — insulin syringe-needle U-100 0.5 mL 31 gauge x 5/16 Syrg
0.5 | 6 refills | Status: AC
Start: 2014-06-27 — End: 2014-07-18
  Filled 2014-06-27: qty 100, 25d supply, fill #1
  Filled 2014-06-27: qty 10, 28d supply, fill #0

## 2014-06-27 MED ORDER — insulin aspart (NOVOLOG) 100 unit/mL injection
100 | SUBCUTANEOUS | 0 refills | Status: AC
Start: 2014-06-27 — End: 2014-06-27

## 2014-06-27 MED ORDER — diphth,pertus(acell),tetanus (BOOSTRIX TDAP) 2.5-8-5 Lf-mcg-Lf/0.5mL syringe Syrg 0.5 mL
2.5-8-5 | Freq: Once | INTRAMUSCULAR | Status: AC
Start: 2014-06-27 — End: 2014-07-04

## 2014-06-27 MED ORDER — insulin NPH (HUMULIN N) 100 unit/mL injection
100 | SUBCUTANEOUS | 0 refills | 29.00000 days | Status: AC
Start: 2014-06-27 — End: 2014-06-27

## 2014-06-27 MED ORDER — insulin NPH (HUMULIN N) 100 unit/mL injection
100 | SUBCUTANEOUS | 3 refills | 29.00000 days | Status: AC
Start: 2014-06-27 — End: 2014-07-18
  Filled 2014-06-27: qty 10, 28d supply, fill #0

## 2014-06-27 MED ORDER — insulin aspart (NOVOLOG) 100 unit/mL injection
100 | SUBCUTANEOUS | 3 refills | Status: AC
Start: 2014-06-27 — End: 2014-07-18

## 2014-06-27 MED ORDER — lancets 28 gauge Misc
28 | 5 refills | Status: AC
Start: 2014-06-27 — End: ?
  Filled 2014-06-27: qty 200, 29d supply, fill #0
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
## 2014-06-27 NOTE — Unmapped (Signed)
RN Note - Spanish interpreter in person used to communicate.  Reviewed information about Tdap, voiced understanding. Tdap given as ordered without incident.  Rosalie Doctor, RN

## 2014-06-27 NOTE — Unmapped (Signed)
DAPP Follow-up:  16XW [id number] at [redacted]w[redacted]d gestational age with a dx of T2DM on SDI.  Patient here today for DAPP RD follow up visit.  Patient has no pregnancy complaints. Patient reports that she usually eats 31meals/day and 3snacks/day.  Patient reports that the diabetes meal plan is going well and has no pregnancy complaints.  Today her RBS was 120. She is present with her blood sugar record and values range 72-169 preprandial and 89-142 post prandial.  She is testing her blood sugars 7x/day.  She is mostly elevated post dinner. States she is eating beans, rice and tortillas.      Filed Vitals:    06/27/14 1334   BP: 115/68   Pulse: 68   Temp: 98.5 ??F (36.9 ??C)     Lab Results   Component Value Date    HGBA1C 5.4 09/24/2010       Height: 4' 10 (147.3 cm)     Wt Readings from Last 10 Encounters:   06/27/14 163 lb 8 oz (74.163 kg)   05/30/14 158 lb 9.6 oz (71.94 kg)   05/16/14 157 lb 14.4 oz (71.623 kg)   05/06/14 153 lb 6.4 oz (69.582 kg)   05/06/14 153 lb 8 oz (69.627 kg)   04/18/14 147 lb 14.4 oz (67.087 kg)   04/08/14 142 lb 11.2 oz (64.728 kg)   04/01/14 140 lb 11.2 oz (63.821 kg)   09/21/10 143 lb 14.4 oz (65.273 kg)     Medication Regimen for Diabetes and other medications:   Current Outpatient Prescriptions on File Prior to Visit   Medication Sig Dispense Refill   ??? acetaminophen (TYLENOL) 325 MG tablet Take 650 mg by mouth every 6 hours as needed. Take 2 tablets every 4-6 hours as need for headache not to exceed 4000 mg per day      ??? blood sugar diagnostic Strp Use as directed to test blood glucose 7 times a day and as needed. 200 strip 5   ??? ferrous sulfate 325 (65 FE) MG tablet Take 325 mg by mouth daily with breakfast.     ??? glucagon, human recombinant, (GLUCAGON) 1 mg Kit Inject 1 kit as directed as needed. 1 kit 0   ??? insulin aspart (NOVOLOG) 100 unit/mL injection Inject 8 units with breakfast and 6 units with dinner 10 mL 0   ??? insulin NPH (NOVOLIN N) 100 unit/mL injection Inject 19 units in the morning  and 8 units at night. 10 mL 0   ??? insulin syringe-needle U-100 0.5 mL 31 gauge x 5/16 Syrg Use to inject insulin 200 each 6   ??? lancets 28 gauge Misc Use as directed to test blood glucose 7 times a day and as needed. 200 each 5   ??? PNV NO.27/IRON CARB & FUM/FA (PNV NO.27-IRON CARBON & FUM-FA ORAL) Take 1 capsule by mouth.     ??? senna (SENOKOT) 8.6 mg tablet Take 1 tablet by mouth daily. Take 1 tablet by mouth twice a day      ??? [DISCONTINUED] insulin aspart (NOVOLOG) 100 unit/mL injection Inject 5 Units subcutaneously with breakfast, 5Units  with dinner 10 mL 0   ??? [DISCONTINUED] insulin NPH (HUMULIN N) 100 unit/mL injection Inject 20units subcutaneously with breakfast and 7 Units before bedtime 10 mL 0     No current facility-administered medications on file prior to visit.     Vitamin/Supplement Use: PNV  Work Schedule: Patient reports she is not working    24 HOUR DIET RECALL/REVIEW:  MORNING:1 slice bread, 2 eggs, beans (1/2cup)  TIME:6:50am    SNACK:1/2 banana and cheese  TIME:11:50am    NOON:sandwich  TIME:12:50pm    SNACK:apple and peanuts  TIME:2-3pm    EVENING:chicken, rice, tortillas and beans  TIME:6pm    SNACK:5 crackers with cheese  TIME:10pm    Exercise Pattern (time,day,duration,type): patient reports she is not exercising     Primary Dietary Issues/Adherence to Meal Plan: patient consuming 4-5 servings of carbohydrates at dinner, fruit in the AM     Educational Handouts Provided at Visit: none provided at this visit     Plan/Recommendations:  1. Continue meal plan (30,15,45,15,45,15-30), Reviewed the importance of timing of meals (8am, 10am, 12pm, 3pm, 6pm 10pm).   2. Encouraged physical activity (30 minutes daily after dinner)  3. Reviewed blood sugar logs and times to test (fasting in AM, 1 hour after breakfast, before lunch, 1 hour after lunch, before dinner, 1 hour after dinner, before bedtime snack). Reviewed blood sugar ranges for pregnancy (60-90 preprandial and 90-120 1 hr postprandial)    4. Reviewed high protein + carbohydrate snacks (15 grams of carbohydrate + protein) and provided patient with examples.   5. Encouraged 28gm/day dietary fiber intake   6. Reviewed that fruit in the AM may elevate blood sugars   7. Reviewed portion sizes of beans, rice, tortillas     Harden Mo, MS, RD, LD

## 2014-06-27 NOTE — Unmapped (Signed)
MFM Attending    I did not see or examine the patient. She is being seen today for her Class B/Type 2 Diabetes at [redacted]w[redacted]d. I discussed her with Dr. Elgie Collard and agree with the plan as noted. She missed her last appointment and brings in 4 weeks of glucose.  She is currently on SDI and her control previously has been very good but in the last week her values are starting to increase. Her fastings, post lunch, and post dinner values are elevated. After review of her meals she is eating too many carbohydrates for dinner and she was seen by our RD today for counseling. I recommended increasing her hs NPH and her dinner log. Her prelunch values are low, therefore she will likely need the addition of lunch time log if her lunch values remain elevated, but I have not added that today. We will see her back in 1 week to review her sugars.     Maryruth Hancock, MD

## 2014-06-27 NOTE — Unmapped (Signed)
Hi Lauren- I cant close her chart because there are immunizations pending.

## 2014-06-28 NOTE — Unmapped (Signed)
Met with pt. All communication done with live interpreter. Fetal movement and kick counts discussed. Reviewed SUIDS with patient including what is considered a safe sleep surface for baby.  Discussed patient's plan for obtaining crib for baby. Pt stated she has a car seat and crib.  Handout given to patient depicting safe sleep environment.  Encouraged patient to review safe sleep with all infant caregivers. Discussed contraception, pt originally wanted BTL but is unable to afford the procedure. List of all available methods (in spanish) given and explained. Spanish [employer] and [employer] given and explained. Pt still unsure and will discuss with husband. Pt to lab. Assisted pt with scheduling ANFS, ROB and serial growth ultrasounds. Encouraged pt to call with questions or concerns.  Semaja Lymon J Jennell Janosik, RNC-OB,BSN

## 2014-06-28 NOTE — Unmapped (Signed)
All communication done with [employer]. Reminded pt of scheduled growth ultrasound and dapp appointment on 07/04/14. Also, discussed ANFS to start on 07/18/14. Pt verbalized understanding. Encouraged pt to call with questions or concerns.

## 2014-07-03 NOTE — Unmapped (Signed)
I cannot close this chart because of pending immunizations. Can you both figure out what is missing?    Thanks,  [employer]

## 2014-07-04 ENCOUNTER — Ambulatory Visit

## 2014-07-08 NOTE — Unmapped (Signed)
See scanned fetal echo report.

## 2014-07-18 ENCOUNTER — Ambulatory Visit: Admit: 2014-07-18 | Discharge: 2014-07-18

## 2014-07-18 ENCOUNTER — Other Ambulatory Visit: Admit: 2014-07-18

## 2014-07-18 ENCOUNTER — Ambulatory Visit: Admit: 2014-07-18 | Attending: Maternal & Fetal Medicine

## 2014-07-18 DIAGNOSIS — O0992 Supervision of high risk pregnancy, unspecified, second trimester: Secondary | ICD-10-CM

## 2014-07-18 DIAGNOSIS — O24112 Pre-existing diabetes mellitus, type 2, in pregnancy, second trimester: Secondary | ICD-10-CM

## 2014-07-18 LAB — URINALYSIS-MACROSCOPIC W/REFLEX TO MICROSCOPIC
Bilirubin, UA: NEGATIVE
Blood, UA: NEGATIVE
Glucose, UA: NEGATIVE mg/dL
Ketones, UA: NEGATIVE mg/dL
Leukocyte Esterase, UA: NEGATIVE
Nitrite, UA: NEGATIVE
Protein, UA: NEGATIVE mg/dL
Specific Gravity, UA: 1.01 (ref 1.005–1.035)
Urobilinogen, UA: 0.2 EU/dL (ref 0.2–1.0)
pH, UA: 6 (ref 5.0–8.0)

## 2014-07-18 LAB — POC GLU MONITORING DEVICE: POC Glucose Monitoring Device: 122 mg/dL (ref 70–100)

## 2014-07-18 MED ORDER — insulin syringe-needle U-100 0.5 mL 31 gauge x 5/16 Syrg
0.5 | 6 refills | Status: AC
Start: 2014-07-18 — End: ?
  Filled 2014-07-18: qty 100, 25d supply, fill #0

## 2014-07-18 MED ORDER — blood sugar diagnostic Strp
ORAL_STRIP | 5 refills | 30.00000 days | Status: AC
Start: 2014-07-18 — End: ?
  Filled 2014-07-18: qty 200, 28d supply, fill #0

## 2014-07-18 MED ORDER — insulin NPH (HUMULIN N) 100 unit/mL injection
100 | SUBCUTANEOUS | 3 refills | 29.00000 days | Status: AC
Start: 2014-07-18 — End: ?
  Filled 2014-07-18 (×2): qty 10, 28d supply, fill #0

## 2014-07-18 MED ORDER — blood-glucose meter Misc
0 refills | 8.00000 days | Status: AC
Start: 2014-07-18 — End: ?

## 2014-07-18 MED ORDER — insulin aspart (NOVOLOG) 100 unit/mL injection
100 | SUBCUTANEOUS | 3 refills | Status: AC
Start: 2014-07-18 — End: ?

## 2014-07-18 NOTE — Unmapped (Signed)
MFM attending-    Patient is here for DAPP follow up visit.    I  agree with Dr Loralie Champagne assessment, findings and plan.    Patient is at 32+ wks of gestation with pregest DM on split dose insulin.  She has reasonable glycemic control but with some mild hyperglycemia, and we will make some dose adjustment of her insulin today.  Agree with follow up plan as outlined.    Ammie Ferrier, DO  Maternal-Fetal Medicine attending physician

## 2014-07-18 NOTE — Unmapped (Signed)
No RN Exit - see MD documentation. Jyl Chico, RN
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
## 2014-07-22 ENCOUNTER — Ambulatory Visit: Admit: 2014-07-22 | Attending: Maternal & Fetal Medicine

## 2014-07-22 DIAGNOSIS — O24112 Pre-existing diabetes mellitus, type 2, in pregnancy, second trimester: Secondary | ICD-10-CM

## 2014-07-22 NOTE — Unmapped (Signed)
Baby 1  FHR Baseline: 150  Variability: moderate (6-25 beats per minute)  Acceleration: Present  Decelerations Episodic: no  Uterine Contractions: no  Irritability: yes  Comments:   External fetal monitoring done and physician notified for interpretation: yes    Patient's last physician office visit is 07/18/14   Next physician office visit is 07/25/14    I have discussed with patient fetal kick counts and provided literature: yes  Importance of compliance with antenatal testing discussed:  yes    Performed by:  Phylis Bougie

## 2014-07-22 NOTE — Unmapped (Signed)
NST MFM Interpretation  07/22/2014  1:46 PM    NST Interpretation  reactive  Continue antenatal fetal testing as planned.     Symantha Steeber Knox Royalty MD

## 2014-07-22 NOTE — Unmapped (Signed)
OBGYN billing only.

## 2014-07-25 ENCOUNTER — Ambulatory Visit

## 2014-07-25 ENCOUNTER — Other Ambulatory Visit: Admit: 2014-07-25

## 2014-07-25 ENCOUNTER — Ambulatory Visit: Admit: 2014-07-25

## 2014-07-25 DIAGNOSIS — O0992 Supervision of high risk pregnancy, unspecified, second trimester: Secondary | ICD-10-CM

## 2014-07-25 DIAGNOSIS — O24112 Pre-existing diabetes mellitus, type 2, in pregnancy, second trimester: Secondary | ICD-10-CM

## 2014-07-25 LAB — URINALYSIS-MACROSCOPIC W/REFLEX TO MICROSCOPIC
Bilirubin, UA: NEGATIVE
Blood, UA: NEGATIVE
Glucose, UA: NEGATIVE mg/dL
Ketones, UA: NEGATIVE mg/dL
Leukocytes, UA: NEGATIVE
Nitrite, UA: NEGATIVE
Protein, UA: NEGATIVE mg/dL
Specific Gravity, UA: 1.01 (ref 1.005–1.035)
Urobilinogen, UA: 0.2 EU/dL (ref 0.2–1.0)
pH, UA: 6.5 (ref 5.0–8.0)

## 2014-07-25 LAB — POC GLU MONITORING DEVICE: POC Glucose Monitoring Device: 87 mg/dL (ref 70–100)

## 2014-07-25 NOTE — Unmapped (Signed)
33 3/[redacted] weeks gestation.  Class B diabetes on Split dose insulin with fair glycemic control .     No specific complaints.  Reports good daily fetal movement.  Exam:   WT 169#   FHR+  Filed Vitals:    07/25/14 1522   BP: 131/79   Pulse: 63   Temp: 97.7 ??F (36.5 ??C)     Insulin dose adjusted as recorded.  Continue with twice a week fetal testing.    RTC 1 week.    I did not see/examine this patient.  I discussed and agree with the fellow's findings and plan of care for this patient as documented in their note.

## 2014-07-25 NOTE — Unmapped (Signed)
Tome 26 unidades del nubloso y 7 unidades del claro por la [place], 8 unidades del claro antes de la cena, y 10 unidades del nubloso antes de dormir.    Tercer trimestre de Psychiatrist  (Third Trimester of Pregnancy)  El tercer trimestre va desde la semana??29 hasta la 42, desde el s??ptimo hasta el noveno mes, y es la ??poca en la que el feto crece m??s r??pidamente. Hacia el final del noveno mes, el feto mide alrededor de 20??pulgadas (45??cm) de largo y pesa entre 6 y 10 libras (2,700 y 4,500??kg).   CAMBIOS EN EL ORGANISMO  Su organismo atraviesa por muchos cambios durante el [place], y estos var??an de Neomia Dear mujer a Educational psychologist.   ?? Seguir?? aumentando de [place]. Es de esperar que aumente entre 25 y 35??libras (11 y 16??kg) Psychiatric nurse final del Psychiatrist.  ?? Podr??n aparecer las primeras estr??as en las caderas, el abdomen y [place].  ?? Puede tener necesidad de Geographical information systems officer con m??s frecuencia porque el feto baja hacia la pelvis y ejerce presi??n sobre la vejiga.  ?? Debido al Vanetta Mulders podr?? sentir acidez estomacal con frecuencia.  ?? Puede estar estre??ida, ya que ciertas hormonas enlentecen los movimientos de los m??sculos que empujan los desechos a trav??s de los intestinos.  ?? Pueden aparecer hemorroides o abultarse e hincharse las venas (venas varicosas).  ?? Puede sentir dolor p??lvico debido al [employer] y a que las hormonas del Management consultant las articulaciones entre los huesos de la pelvis. El dolor de espalda puede ser consecuencia de la sobrecarga de los m??sculos que soportan la postura.  ?? Tal vez haya cambios en el cabello que pueden incluir su engrosamiento, crecimiento r??pido y [employer] textura. Adem??s, a algunas mujeres se les cae el cabello durante o despu??s del [place], o tienen el cabello seco o fino. Lo m??s probable es que el cabello se le normalice despu??s del nacimiento del beb??.  ?? Las mamas seguir??n creciendo y le doler??n. A veces, puede haber una secreci??n amarilla de las [employer].  ?? El ombligo puede  salir hacia afuera.  ?? Puede sentir que le falta el aire debido a que se expande el ??tero.  ?? Puede notar que el feto baja o lo siente m??s bajo, en el abdomen.  ?? Puede tener una p??rdida de secreci??n mucosa con sangre. Esto suele ocurrir en el t??rmino de unos pocos d??as a una semana antes de que comience el [place].  ?? El cuello del ??tero se vuelve delgado y blando (se borra) cerca de la fecha de parto.  QU?? DEBE ESPERAR EN LOS EX??MENES PRENATALES   Le har??n ex??menes prenatales cada 2??semanas hasta la semana??36. A partir de ese momento le har??n ex??menes semanales. Durante una visita prenatal de rutina:  ?? La pesar??n para asegurarse de que usted y el feto est??n creciendo normalmente.  ?? Le tomar??n la presi??n arterial.  ?? Le medir??n el abdomen para controlar el desarrollo del beb??.  ?? Se escuchar??n los latidos card??acos fetales.  ?? Se evaluar??n los resultados de los estudios solicitados en visitas anteriores.  ?? Le revisar??n el cuello del ??tero cuando est?? pr??xima la fecha de parto para controlar si este se ha borrado.  Alrededor de la semana??36, el m??dico le revisar?? el cuello del ??tero. Al mismo tiempo, realizar?? un an??lisis de las secreciones del tejido vaginal. Este examen es para determinar si hay un tipo de bacteria, estreptococo Grupo B. El m??dico le explicar?? esto con  m??s detalle.  El m??dico puede preguntarle lo siguiente:  ?? C??mo le gustar??a que Equities trader.  ?? C??mo se siente.  ?? Si siente los movimientos del beb??.  ?? Si ha tenido s??ntomas anormales, como p??rdida de l??quido, sangrado, dolores de cabeza intensos o c??licos abdominales.  ?? Si tiene [employer].  Otros ex??menes o estudios de detecci??n que pueden realizarse durante el tercer trimestre incluyen lo siguiente:  ?? An??lisis de sangre para controlar las concentraciones de hierro (anemia).  ?? Controles fetales para determinar su salud, nivel de [place] y Designer, jewellery. Si tiene [place] enfermedad o hay problemas durante el [place], le  har??n estudios.  FALSO TRABAJO DE PARTO  Es posible que sienta contracciones leves e irregulares que finalmente desaparecen. Se llaman contracciones de [address] o falso trabajo de [place]. Las contracciones pueden durar horas, d??as o incluso semanas, antes de que el verdadero trabajo de parto se inicie. Si las contracciones ocurren a intervalos regulares, se intensifican o se hacen dolorosas, lo mejor es que la revise el m??dico.   SIGNOS DE TRABAJO DE PARTO   ?? C??licos de tipo menstrual.  ?? Contracciones cada 5??minutos o menos.  ?? Contracciones que comienzan en la parte superior del ??tero y se extienden hacia abajo, a la zona inferior del abdomen y la espalda.  ?? Sensaci??n de mayor presi??n en la pelvis o dolor de espalda.  ?? Neomia Dear secreci??n de mucosidad acuosa o con [employer] de la vagina.  Si tiene alguno de estos signos antes de la semana??37 del Psychiatrist, llame a su m??dico de inmediato. Debe concurrir al hospital para que la controlen inmediatamente.  INSTRUCCIONES PARA EL CUIDADO EN EL HOGAR   ?? Evite fumar, consumir hierbas, beber alcohol y tomar f??rmacos que no le hayan recetado. Estas sustancias qu??micas afectan la formaci??n y el desarrollo del beb??.  ?? Siga las indicaciones del m??dico en relaci??n con el uso de medicamentos. Durante el embarazo, hay medicamentos que son seguros de tomar y otros que no.  ?? Margie Billet f??sica solo en la forma indicada por el m??dico. Sentir c??licos uterinos es un buen signo para Restaurant manager, fast food actividad f??sica.  ?? Contin??e comiendo alimentos que sanos con regularidad.  ?? Use un sost??n que le brinde buen soporte si le duelen las [place].  ?? No se d?? ba??os de inmersi??n en agua caliente, ba??os turcos ni saunas.  ?? Col??quese el cintur??n de seguridad cuando conduzca.  ?? No coma carne cruda ni queso sin cocinar; evite el contacto con las bandejas sanitarias de los gatos y la tierra que estos animales usan. Estos elementos contienen g??rmenes que pueden causar defectos cong??nitos en  el beb??.  ?? Tome las vitaminas prenatales.  ?? Si est?? estre??ida, pruebe un laxante suave (si el m??dico lo autoriza). Consuma m??s alimentos ricos en fibra, como vegetales y frutas frescos y cereales integrales. Beba gran cantidad de l??quido para mantener la orina de tono claro o color amarillo p??lido.  ?? Dese ba??os de asiento con agua tibia para Engineer, materials o las molestias causadas por las hemorroides. Use una crema para las hemorroides si el m??dico la autoriza.  ?? Si tiene venas varicosas, use medias de descanso. Eleve los pies durante 15??minutos, 3 o 4??veces por d??a. Limite la cantidad de sal en su dieta.  ?? Evite levantar objetos pesados, use zapatos de tacones bajos y [place].  ?? Descanse con las piernas elevadas si tiene calambres o dolor de cintura.  ??  Visite a su dentista si no lo ha Occupational hygienist. Use un cepillo de dientes blando para higienizarse los dientes y p??sese el hilo dental con suavidad.  ?? Puede seguir [employer], a menos que el m??dico le indique lo contrario.  ?? No haga viajes largos excepto que sea absolutamente necesario y solo con la autorizaci??n del m??dico.  ?? Tome clases prenatales para entender, Education administrator y hacer preguntas sobre el [place] de parto y [place].  ?? Haga un ensayo de la partida al hospital.  ?? Prepare el bolso que llevar?? al hospital.  ?? Prepare la habitaci??n del beb??.  ?? Concurra a todas las visitas prenatales seg??n las indicaciones de su m??dico.  SOLICITE ATENCI??N M??DICA SI:  ?? No est?? segura de que est?? en trabajo de parto o de que ha roto la bolsa de las aguas.  ?? Tiene mareos.  ?? Siente c??licos leves, presi??n en la pelvis o dolor persistente en el abdomen.  ?? Tiene n??useas, v??mitos o diarrea persistentes.  ?? Tiene secreci??n vaginal con mal olor.  ?? Siente dolor al [employer].  SOLICITE ATENCI??N M??DICA DE INMEDIATO SI:   ?? Tiene fiebre.  ?? Tiene una p??rdida de l??quido por la vagina.  ?? Tiene sangrado o peque??as p??rdidas  vaginales.  ?? Siente dolor intenso o c??licos en el abdomen.  ?? Sube o baja de peso r??pidamente.  ?? Tiene dificultad para respirar y siente dolor de pecho.  ?? S??bitamente se le hinchan mucho el rostro, las manos, los tobillos, los pies o las piernas.  ?? No ha sentido los movimientos del beb?? durante una hora.  ?? Siente un dolor de cabeza intenso que no se alivia con medicamentos.  ?? Hay cambios en la visi??n.  Document Released: 10/21/2004 Document Revised: 01/16/2013  ExitCare?? Patient Information ??2015 [place], [place]. This information is not intended to replace advice given to you by your health care provider. Make sure you discuss any questions you have with your health care provider.
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
## 2014-07-25 NOTE — Unmapped (Signed)
No RN Exit needed - see MD documentation.  Rosalie Doctor, RN

## 2014-07-26 NOTE — Unmapped (Signed)
All communication done with [employer]. Notified pt of scheduled lactation consult on 08/12/14 at 2:20pm. Pt verbalized understanding. All questions answered. Encouraged pt to call with further questions or concerns.

## 2014-07-26 NOTE — Unmapped (Signed)
Met with pt. All communication done with [employer].  Notified pt of scheduled NST in triage on 07/29/14 at 11:00am. Pt verbalized understanding. Pt denies any needs. Encouraged pt to call with questions or concerns.  Martavion Couper J Mirah Nevins, RNC-OB,BSN

## 2014-07-29 NOTE — Unmapped (Signed)
R2 LABOR AND DELIVERY HISTORY AND PHYSICAL    Patient Name: Lori Jacobs  MRN: [id number]  Date of Birth: 06-Apr-1985  Date of Service: 07/29/2014     Presented to triage for routine NST.     Lori Jacobs is a 29 y.o. [phone] at [redacted]w[redacted]d weeks gestation who presents for routine NST. She denies LOF, vaginal bleeding, contractions. She reports normal fetal movement.     Filed Vitals:    07/29/14 1101   BP: 111/74   Pulse: 79   Resp: 22   SpO2: 97%     FHT: 140 bpm baseline, moderate variability, accelerations present, decelerations none   Toco: contractions occasional, mild    Assessment/Plan  28 y.o. [id number] at [redacted]w[redacted]d weeks gestation who presented for routine NST. Asymptomatic. Tracing is category I, reactive and reassuring. Case discussed and tracing reviewed by Dr. Porfirio Oar (Attending).     Angelica Chessman, MD  OB/GYN PGY-2

## 2014-07-29 NOTE — Unmapped (Addendum)
Inpatient NST--Nursing Note  07/29/2014 11:33 AM     Tracing starts at 1111  Baby 1  FHR Baseline:  140  Variability:  moderate (6-25 beats per minute)  Acceleration: Present  Decelerations Episodic:  no  Uterine Contractions:  yes  Irritability:  no  Comments:  External fetal monitoring done and physician notified for interpretation:  yes      ___________________________      Resident NST interpretation    FHR: baseline 140, moderate variability, + accelerations, no decelerations  Toco: irregular ctx    Assessment: reactive NST    Plan: continue with ANFS as scheduled    Cordie Grice MD  [facility] PGY-3  Pager [phone]  OB phone [phone]  Cell [phone]

## 2014-07-29 NOTE — Unmapped (Addendum)
29 yo g4 p3 34 wks presents to triage for scheduled nst.  Pt denies vaginal bleeding and leaking.  Pt reports fetal movement.

## 2014-08-01 ENCOUNTER — Ambulatory Visit

## 2014-08-05 ENCOUNTER — Ambulatory Visit

## 2014-08-06 ENCOUNTER — Encounter: Payer: Self-pay | Admitting: *Deleted

## 2014-08-08 ENCOUNTER — Ambulatory Visit

## 2014-08-12 ENCOUNTER — Ambulatory Visit (INDEPENDENT_AMBULATORY_CARE_PROVIDER_SITE_OTHER): Payer: Self-pay | Admitting: Advanced Practice Midwife

## 2014-08-12 ENCOUNTER — Encounter: Payer: Self-pay | Attending: Obstetrics & Gynecology | Admitting: *Deleted

## 2014-08-12 ENCOUNTER — Encounter: Payer: Self-pay | Admitting: Advanced Practice Midwife

## 2014-08-12 ENCOUNTER — Other Ambulatory Visit: Payer: Self-pay | Admitting: Advanced Practice Midwife

## 2014-08-12 ENCOUNTER — Ambulatory Visit

## 2014-08-12 VITALS — BP 120/77 | HR 68 | Temp 98.0°F | Ht 59.0 in | Wt 168.5 lb

## 2014-08-12 DIAGNOSIS — IMO0001 Reserved for inherently not codable concepts without codable children: Secondary | ICD-10-CM

## 2014-08-12 DIAGNOSIS — O24913 Unspecified diabetes mellitus in pregnancy, third trimester: Secondary | ICD-10-CM

## 2014-08-12 DIAGNOSIS — O09893 Supervision of other high risk pregnancies, third trimester: Secondary | ICD-10-CM

## 2014-08-12 DIAGNOSIS — Z1151 Encounter for screening for human papillomavirus (HPV): Secondary | ICD-10-CM

## 2014-08-12 DIAGNOSIS — Z124 Encounter for screening for malignant neoplasm of cervix: Secondary | ICD-10-CM

## 2014-08-12 DIAGNOSIS — Z23 Encounter for immunization: Secondary | ICD-10-CM

## 2014-08-12 DIAGNOSIS — R030 Elevated blood-pressure reading, without diagnosis of hypertension: Secondary | ICD-10-CM

## 2014-08-12 DIAGNOSIS — Z113 Encounter for screening for infections with a predominantly sexual mode of transmission: Secondary | ICD-10-CM

## 2014-08-12 DIAGNOSIS — Z118 Encounter for screening for other infectious and parasitic diseases: Secondary | ICD-10-CM

## 2014-08-12 DIAGNOSIS — O24919 Unspecified diabetes mellitus in pregnancy, unspecified trimester: Secondary | ICD-10-CM | POA: Insufficient documentation

## 2014-08-12 DIAGNOSIS — O09899 Supervision of other high risk pregnancies, unspecified trimester: Secondary | ICD-10-CM | POA: Insufficient documentation

## 2014-08-12 DIAGNOSIS — O0933 Supervision of pregnancy with insufficient antenatal care, third trimester: Secondary | ICD-10-CM | POA: Insufficient documentation

## 2014-08-12 DIAGNOSIS — N9089 Other specified noninflammatory disorders of vulva and perineum: Secondary | ICD-10-CM

## 2014-08-12 DIAGNOSIS — Z713 Dietary counseling and surveillance: Secondary | ICD-10-CM | POA: Insufficient documentation

## 2014-08-12 LAB — POCT URINALYSIS DIP (DEVICE)
Bilirubin Urine: NEGATIVE
Glucose, UA: NEGATIVE mg/dL
Hgb urine dipstick: NEGATIVE
Ketones, ur: NEGATIVE mg/dL
Leukocytes, UA: NEGATIVE
Nitrite: NEGATIVE
Protein, ur: NEGATIVE mg/dL
Specific Gravity, Urine: 1.025 (ref 1.005–1.030)
Urobilinogen, UA: 0.2 mg/dL (ref 0.0–1.0)
pH: 6 (ref 5.0–8.0)

## 2014-08-12 LAB — OB RESULTS CONSOLE GC/CHLAMYDIA
Chlamydia: NEGATIVE
GC PROBE AMP, GENITAL: NEGATIVE

## 2014-08-12 LAB — OB RESULTS CONSOLE GBS: GBS: NEGATIVE

## 2014-08-12 LAB — TSH: TSH: 1.444 u[IU]/mL (ref 0.350–4.500)

## 2014-08-12 MED ORDER — TETANUS-DIPHTH-ACELL PERTUSSIS 5-2.5-18.5 LF-MCG/0.5 IM SUSP
0.5000 mL | Freq: Once | INTRAMUSCULAR | Status: AC
  Administered 2014-08-12: 0.5 mL via INTRAMUSCULAR

## 2014-08-12 MED ORDER — ASPIRIN EC 81 MG PO TBEC: 81.0000 mg | DELAYED_RELEASE_TABLET | Freq: Every day | ORAL | Status: DC

## 2014-08-12 MED ORDER — INSULIN ASPART 100 UNIT/ML ~~LOC~~ SOLN: SUBCUTANEOUS | Status: DC

## 2014-08-12 MED ORDER — INSULIN NPH (HUMAN) (ISOPHANE) 100 UNIT/ML ~~LOC~~ SUSP: SUBCUTANEOUS | Status: DC

## 2014-08-12 NOTE — Progress Notes (Signed)
Consult with Dr. Macon LargeAnyanwu regarding fetal echo scheduled for 8/1 (first available appt) - plan of care as follows: Fetal echo appt will be cancelled on 8/1 given that pt will be 38 wks and IOL will be scheduled @ 39 wks. If US on 7/26 shows any suspicion of cardiac abnormality, fetal echo can be scheduled and expedited as per MFM recommendations.  Pt was explained this change in plan of care with aid from interpreter Pearletha AlfredMaria Elena Little. Pt agreed and voiced understanding.

## 2014-08-12 NOTE — Patient Instructions (Signed)
Diabetes mellitus tipo 1 o tipo 2 durante el embarazo (Type 1 or Type 2 Diabetes Mellitus During Pregnancy) La diabetes mellitus, generalmente denominada diabetes, es una enfermedad prolongada (crnica). La diabetes tipo1 ocurre cuando las clulas de los islotes, que estn en el pncreas y producen la hormona [place], se destruyen y ya no pueden producir insulina. La diabetes tipo2 ocurre cuando el pncreas no produce suficiente insulina, las clulas son menos sensibles a la insulina que se produce (resistencia a la insulina), o ambos. La insulina es necesaria para movilizar los azcares de los alimentos a las clulas de los tejidos. Las clulas de los tejidos [employer] azcares para Psychiatrist. La falta de insulina o la falta de una respuesta normal a la insulina hace que el exceso de azcar se acumule en la sangre en lugar de Customer service manager en las clulas de los tejidos. Como resultado, se producen niveles altos de Banker (hiperglucemia).  Si se mantienen los niveles de glucosa en la sangre en un rango normal antes y Therapist, sports [place], las mujeres pueden tener un embarazo saludable. Si no se controlan los niveles de glucosa en la sangre [place], puede haber riesgos para usted, para el beb que no ha nacido y [place] de parto y [place]. Tambin puede haber riesgos para el beb cuando nazca.  FACTORES DE RIESGO   Una persona est predispuesta a desarrollar diabetes tipo 1 si alguien en su familia padece la enfermedad y se expone a ciertos desencadenantes ambientales adicionales.  Tiene una mayor probabilidad de desarrollar diabetes tipo 2 si tiene antecedentes familiares de diabetes y tambin tiene uno o ms de los siguientes factores de riesgo:  Tener sobrepeso.  Tiene un estilo de vida sedentario.  Ha consumido, a lo [place] de toda su vida, una gran cantidad de alimentos con muchas caloras. SNTOMAS 1. Aumento de la sed (polidipsia). 2. Aumento de la miccin  (poliuria). 3. Orina con ms frecuencia durante la noche (nocturia). 4. Prdida de peso. La prdida de peso puede ser muy rpida. 5. Infecciones frecuentes y recurrentes. 6. Cansancio (fatiga). 7. Debilidad. 8. Cambios en la visin, como visin borrosa. 9. Olor a fruta [employer] 10. Dolor abdominal. 11. Nuseas o vmitos. DIAGNSTICO  La diabetes se diagnostica cuando hay aumento de los niveles de glucosa en la [place]. El nivel de glucosa en la sangre puede controlarse en uno o ms de los siguientes anlisis de sangre:  Medicin de glucosa en la sangre en [place]. No se le permitir comer durante al menos 8 horas antes de que se tome [place] de [place].  Pruebas al azar de glucosa en la sangre. El nivel de glucosa en la sangre se controla en cualquier momento del da sin importar el momento en que haya comido.  Prueba de A1c (hemoglobina glucosilada) Una prueba de A1c proporciona informacin sobre el control de la glucosa en la sangre durante los ltimos 3 meses.  Prueba de tolerancia a la glucosa oral (PTGO). La glucosa en la sangre se mide despus de no haber comido (ayunas) durante una a tres horas y despus de beber una bebida que contenga glucosa. La prueba de tolerancia a la glucosa oral se [employer] 24 a 28 del Psychiatrist. TRATAMIENTO   Usted tendr que tomar medicamentos para la diabetes o insulina diariamente para Pharmacologist los niveles de glucosa en la sangre en el rango deseado.  Usted tendr [employer] dosis de insulina con la actividad fsica y  la eleccin de alimentos saludables. El objetivo del tratamiento es mantener el nivel de azcar en la sangre previo a comer (preprandial) y durante la noche entre 60 y 99mg /dl, durante todo Firefighterel embarazo. El objetivo del tratamiento es mantener el mayor nivel de azcar en la sangre despus de comer (glucosa pospandial) entre 100 y 140mg /dl.  INSTRUCCIONES PARA EL CUIDADO EN EL HOGAR   Controle su nivel de  hemoglobina A1c dos veces al ao.  Contrlese a diario el nivel de glucosa en la sangre segn las indicaciones de su mdico. Es comn Education officer, environmentalrealizar controles frecuentes de la glucosa en la [place].  Supervise las cetonas en la orina cuando est enfermo y segn las indicaciones de su mdico.  Tome el medicamento para la diabetes y adminstrese insulina segn las indicaciones de su mdico para Radio producermantener el nivel de glucosa en la sangre en el rango deseado.  Nunca se quede sin medicamento para la diabetes o sin insulina. Es necesario que la reciba CarMaxtodos los das.  Ajuste la insulina segn la ingesta de hidratos de carbono. Los hidratos de carbono pueden aumentar los niveles de glucosa en la sangre, pero deben incluirse en su dieta. Aportan vitaminas, minerales y Guyanafibra que son Neomia Dearuna parte esencial de una dieta saludable. Los hidratos de carbono se encuentran en frutas, verduras, cereales integrales, productos lcteos, legumbres y alimentos que contienen azcares aadidos.  Consuma alimentos saludables. Alterne 3 comidas con 3 colaciones.  Aumente de peso saludablemente. El aumento del peso total vara de acuerdo con el ndice de masa corporal que tena antes del embarazo [facility]  Lleve una tarjeta de alerta mdica o use un brazalete o medalla de alerta mdica.  Lleve con usted una colacin de 15gramos de carbohidratos en todo momento para controlar los niveles bajos de glucosa en la sangre (hipoglucemia). Algunos ejemplos de colaciones de 15gramos de hidratos de carbono son los siguientes:  Tabletas de glucosa, 3 o 4.  Gel de glucosa, tubo de 15 gramos.  Pasas de uva, 2cucharadas (24gramos).  Caramelos de goma, 6.  Galletas de Norwalkanimales, 8.  Jugo de fruta, gaseosa comn, o [place] descremada, 4 onzas (120 ml).  Pastillas de goma, 9.  Reconocer la hipoglucemia. Durante el embarazo la hipoglucemia se produce cuando hay niveles de glucosa en la sangre de 60 mg/dl o menos. El riesgo de hipoglucemia  aumenta durante el ayuno o cuando se saltea las comidas, durante o despus de Education officer, environmentalrealizar ejercicio intenso y [place]mientras duerme. Los sntomas de hipoglucemia son:  Temblores o sacudidas.  Disminucin de la capacidad de concentracin.  Sudoracin.  Aumento de la frecuencia cardaca.  Dolor de Turk[place]istancabeza.  Sequedad en la boca.  Hambre.  Irritabilidad.  Ansiedad.  Sueo agitado.  Alteracin del habla o de la coordinacin.  Confusin.  Tratar la hipoglucemia rpidamente. Si usted est alerta y puede tragar con seguridad, siga la regla de 15/15 que consiste en:  [place] Southernome entre 15 y 20gramos de glucosa de accin rpida o carbohidratos. Las opciones de accin rpida son un gel de glucosa, tabletas de glucosa, o 4 onzas (120 ml) de jugo de frutas, gaseosa comn, o leche baja en grasa.  Compruebe su nivel de glucosa en la sangre 15 minutos despus de tomar la glucosa.  Tome entre 15 y 20gramos ms de glucosa si el nivel de glucosa en la sangre todava es de 70mg /dl o inferior.  Ingiera una comida o una colacin en el lapso de 1 hora una vez que los niveles de glucosa en la [place]  vuelven a la normalidad.  Haga actividad fsica por lo menos al da o como lo indique su mdico. Se recomienda que 30 minutos despus de cada comida, realice diez minutos de actividad fsica para controlar los niveles de glucosa postprandial en la [place].  Est alerta a la poliuria (miccin excesiva) y la polidipsia (sensacin de mucha sed), que son los primeros signos de la hiperglucemia. El reconocimiento temprano de la hiperglucemia permite un tratamiento oportuno. Trate la hiperglucemia segn le indic su mdico.  Ajuste su dosis de insulina y la ingesta de alimentos, segn sea necesario, si inicia un nuevo ejercicio o deporte.  Siga su plan para los [address] de enfermedad cuando no puede comer o beber como de [place].  Evite el tabaco y el alcohol.  Concurra a todas las visitas de control como se lo  haya indicado el mdico.  Siga el consejo del mdico respecto a los controles prenatales y posteriores al parto (postparto), las visitas, la planificacin de las comidas, el ejercicio, los medicamentos, las vitaminas, los anlisis de [place], otras pruebas mdicas y [place] fsicas.  Cuide diariamente la piel y los pies. Examine su piel y los pies diariamente para ver si tiene cortes, moretones, enrojecimiento, problemas en las uas, sangrado, ampollas o Advertising account planner. Su mdico debe hacerle un examen de los pies una vez por ao.  Cepllese los dientes y encas por lo menos dos veces al da y use hilo dental al menos una vez por da. Concurra regularmente a las visitas de control con el dentista.  Programe un examen de vista durante el primer trimestre de su embarazo o como lo indique su mdico.  Comparta su plan de control de diabetes en el trabajo o en la escuela.  Mantngase al da con las vacunas.  Aprenda a Dealer.  Obtenga la mayor cantidad posible de informacin sobre la diabetes y solicite ayuda siempre que sea necesario.  Su mdico puede recomendarle que tome una aspirina de dosis baja (81mg ) cada da a fin de ayudar a prevenir la hipertensin durante el embarazo (preeclampsia o eclampsia). Puede estar en riesgo de padecer preeclampsia o eclampsia si:  Padeci preeclampsia o eclampsia durante un embarazo anterior.  Su beb no creci segn lo previsto durante un embarazo anterior.  Tuvo un parto prematuro en un embarazo anterior.  Experiment una separacin de la placenta desde el tero (desprendimiento abrupto de la placenta) durante un embarazo anterior.  Perdi un beb en un embarazo anterior.  Est embarazada de ms de un beb.  Padece otras afecciones mdicas, como hipertensin arterial o una enfermedad autoinmunitaria. SOLICITE ATENCIN MDICA SI:   No puede comer alimentos o beber por ms de 6 horas.  Tuvo nuseas o ha vomitado durante ms de 6  horas.  Tiene un nivel de glucosa en la sangre de 200 mg/dl y cetonas en la orina.  Presenta algn cambio en el estado mental.  Desarrolla problemas de visin.  Sufre un dolor persistente de Training and development officer.  Siente dolor o molestias en la parte superior del abdomen.  Tiene una enfermedad grave adicional.  Tuvo diarrea durante ms de 6 horas.  Ha estado enfermo o ha tenido fiebre durante 2 [address] y no [place]. SOLICITE ATENCIN MDICA DE INMEDIATO SI:  Tiene dificultad para respirar.  Ya no siente los movimientos del beb.  Est sangrando o tiene flujo vaginal.  Comienza a tener contracciones o trabajo de [place] prematuro. ASEGRESE DE QUE:  Comprende estas instrucciones.  Controlar su afeccin.  Recibir [place] de  inmediato si no mejora o si empeora. Document Released: 10/06/2011 Document Revised: 05/28/2013 [facility] Patient Information 2015 [place], [place]. This information is not intended to replace advice given to you by your health care provider. Make sure you discuss any questions you have with your health care provider.  Evaluacin de los movimientos fetales  (Fetal Movement Counts) Nombre del paciente: __________________________________________________ [name] estimada: ____________________ Caroleen Hamman de los movimientos fetales es muy recomendable en los embarazos de alto riesgo, pero tambin es una buena idea que lo hagan todas las [place] El Firefighter que comience a contarlos a las 28 semanas de [place]. Los movimientos fetales suelen aumentar:   Despus de Animator.  Despus de la actividad fsica.  Despus de comer o beber [employer] o fro.  En reposo. Preste atencin cuando sienta que el beb est ms activo. Esto le ayudar a notar un patrn de ciclos de vigilia y sueo de su beb y cules son los factores que contribuyen a un aumento de los movimientos fetales. Es importante llevar a cabo un recuento de movimientos fetales, al  mismo tiempo cada da, cuando el beb normalmente est ms activo.  CMO CONTAR LOS MOVIMIENTOS FETALES 12. Busque un lugar tranquilo y cmodo para sentarse o recostarse sobre el lado izquierdo. Al recostarse sobre su lado izquierdo, le proporciona una mejor circulacin de [place] y oxgeno al beb. 13. Anote el da y la hora en una hoja de papel o en un diario. 14. Comience contando las pataditas, revoloteos, chasquidos, vueltas o pinchazos en un perodo de 2 horas. Debe sentir al menos 10 movimientos en 2 horas. 15. Si no siente 10 movimientos en 2 horas, espere 2  3 horas y cuente de nuevo. Busque cambios en el patrn o si no cuenta lo suficiente en 2 horas. SOLICITE ATENCIN MDICA SI:   Siente menos de 10 pataditas en 2 horas, en dos intentos.  No hay movimientos durante una hora.  El patrn se modifica o le lleva ms tiempo Art gallery manager las 10 pataditas.  Siente que el beb no se mueve como lo hace habitualmente. Fecha: ____________ Movimientos: ____________ Stevan Born inicio: ____________ Stevan Born finalizacin: ____________  Franco Nones: ____________ Movimientos: ____________ Stevan Born inicio: ____________ Stevan Born finalizacin: ____________  Franco Nones: ____________ Movimientos: ____________ Stevan Born inicio: ____________ Stevan Born finalizacin: ____________  Franco Nones: ____________ Movimientos: ____________ Stevan Born inicio: ____________ Stevan Born finalizacin: ____________  Franco Nones: ____________ Movimientos: ____________ Stevan Born inicio: ____________ Mammie Russian de finalizacin: ____________  Franco Nones: ____________ Movimientos: ____________ Mammie Russian de inicio: ____________ Mammie Russian de finalizacin: ____________  Franco Nones: ____________ Movimientos: ____________ Mammie Russian de inicio: ____________ Mammie Russian de finalizacin: ____________  Franco Nones: ____________ Movimientos: ____________ Mammie Russian de inicio: ____________ Mammie Russian de finalizacin: ____________  Franco Nones: ____________ Movimientos: ____________ Mammie Russian de inicio: ____________ Mammie Russian de finalizacin:  ____________  Franco Nones: ____________ Movimientos: ____________ Mammie Russian de inicio: ____________ Mammie Russian de finalizacin: ____________  Franco Nones: ____________ Movimientos: ____________ Mammie Russian de inicio: ____________ Mammie Russian de finalizacin: ____________  Franco Nones: ____________ Movimientos: ____________ Mammie Russian de inicio: ____________ Mammie Russian de finalizacin: ____________  Franco Nones: ____________ Movimientos: ____________ Mammie Russian de inicio: ____________ Mammie Russian de finalizacin: ____________  Franco Nones: ____________ Movimientos: ____________ Mammie Russian de inicio: ____________ Mammie Russian de finalizacin: ____________  Franco Nones: ____________ Movimientos: ____________ Mammie Russian de inicio: ____________ Mammie Russian de finalizacin: ____________  Franco Nones: ____________ Movimientos: ____________ Stevan Born inicio: ____________ Stevan Born finalizacin: ____________  Franco Nones: ____________ Movimientos: ____________ Stevan Born inicio: ____________ Stevan Born finalizacin: ____________  Franco Nones: ____________ Movimientos: ____________ Stevan Born inicio: ____________ Mammie Russian de finalizacin: ____________  Fecha: ____________ Movimientos: ____________ Stevan Born inicio: ____________ Stevan Born finalizacin: ____________  Franco Nones: ____________ Movimientos: ____________ Stevan Born inicio: ____________ Stevan Born finalizacin: ____________  Franco Nones: ____________ Movimientos: ____________ Stevan Born inicio: ____________ Stevan Born finalizacin: ____________  Franco Nones: ____________ Movimientos: ____________ Stevan Born inicio: ____________ Stevan Born finalizacin: ____________  Franco Nones: ____________ Movimientos: ____________ Stevan Born inicio: ____________ Stevan Born finalizacin: ____________  Franco Nones: ____________ Movimientos: ____________ Stevan Born inicio: ____________ Mammie Russian de finalizacin: ____________  Franco Nones: ____________ Movimientos: ____________ Mammie Russian de inicio: ____________ Mammie Russian de finalizacin: ____________  Franco Nones: ____________ Movimientos: ____________ Mammie Russian de inicio: ____________ Mammie Russian de finalizacin: ____________  Franco Nones: ____________  Movimientos: ____________ Mammie Russian de inicio: ____________ Mammie Russian de finalizacin: ____________  Franco Nones: ____________ Movimientos: ____________ Mammie Russian de inicio: ____________ Mammie Russian de finalizacin: ____________  Franco Nones: ____________ Movimientos: ____________ Mammie Russian de inicio: ____________ Mammie Russian de finalizacin: ____________  Franco Nones: ____________ Movimientos: ____________ Mammie Russian de inicio: ____________ Mammie Russian de finalizacin: ____________  Franco Nones: ____________ Movimientos: ____________ Mammie Russian de inicio: ____________ Mammie Russian de finalizacin: ____________  Franco Nones: ____________ Movimientos: ____________ Mammie Russian de inicio: ____________ Mammie Russian de finalizacin: ____________  Franco Nones: ____________ Movimientos: ____________ Mammie Russian de inicio: ____________ Mammie Russian de finalizacin: ____________  Franco Nones: ____________ Movimientos: ____________ Mammie Russian de inicio: ____________ Mammie Russian de finalizacin: ____________  Franco Nones: ____________ Movimientos: ____________ Mammie Russian de inicio: ____________ Mammie Russian de finalizacin: ____________  Franco Nones: ____________ Movimientos: ____________ Mammie Russian de inicio: ____________ Mammie Russian de finalizacin: ____________  Franco Nones: ____________ Movimientos: ____________ Mammie Russian de inicio: ____________ Mammie Russian de finalizacin: ____________  Franco Nones: ____________ Movimientos: ____________ Mammie Russian de inicio: ____________ Mammie Russian de finalizacin: ____________  Franco Nones: ____________ Movimientos: ____________ Mammie Russian de inicio: ____________ Mammie Russian de finalizacin: ____________  Franco Nones: ____________ Movimientos: ____________ Mammie Russian de inicio: ____________ Mammie Russian de finalizacin: ____________  Franco Nones: ____________ Movimientos: ____________ Mammie Russian de inicio: ____________ Mammie Russian de finalizacin: ____________  Franco Nones: ____________ Movimientos: ____________ Mammie Russian de inicio: ____________ Mammie Russian de finalizacin: ____________  Franco Nones: ____________ Movimientos: ____________ Mammie Russian de inicio: ____________ Mammie Russian de finalizacin: ____________  Franco Nones: ____________ Movimientos: ____________ Mammie Russian de inicio:  ____________ Mammie Russian de finalizacin: ____________  Franco Nones: ____________ Movimientos: ____________ Mammie Russian de inicio: ____________ Mammie Russian de finalizacin: ____________  Franco Nones: ____________ Movimientos: ____________ Mammie Russian de inicio: ____________ Mammie Russian de finalizacin: ____________  Franco Nones: ____________ Movimientos: ____________ Mammie Russian de inicio: ____________ Mammie Russian de finalizacin: ____________  Franco Nones: ____________ Movimientos: ____________ Mammie Russian de inicio: ____________ Mammie Russian de finalizacin: ____________  Franco Nones: ____________ Movimientos: ____________ Mammie Russian de inicio: ____________ Mammie Russian de finalizacin: ____________  Franco Nones: ____________ Movimientos: ____________ Mammie Russian de inicio: ____________ Mammie Russian de finalizacin: ____________  Franco Nones: ____________ Movimientos: ____________ Mammie Russian de inicio: ____________ Mammie Russian de finalizacin: ____________  Franco Nones: ____________ Movimientos: ____________ Mammie Russian de inicio: ____________ Mammie Russian de finalizacin: ____________  Franco Nones: ____________ Movimientos: ____________ Mammie Russian de inicio: ____________ Mammie Russian de finalizacin: ____________  Franco Nones: ____________ Movimientos: ____________ Mammie Russian de inicio: ____________ Mammie Russian de finalizacin: ____________  Franco Nones: ____________ Movimientos: ____________ Mammie Russian de inicio: ____________ Mammie Russian de finalizacin: ____________  Franco Nones: ____________ Movimientos: ____________ Mammie Russian de inicio: ____________ Mammie Russian de finalizacin: ____________  Document Released: 04/20/2007 Document Revised: 12/29/2011 [employer]Care Patient Information 2015 [place], [place]. This information is not intended to replace advice given to you by your health care provider. Make sure you discuss any questions you have with your health care provider.

## 2014-08-12 NOTE — Progress Notes (Signed)
Nutrition note: 1st visit consult & GDM diet education Pt has had Type 2 DM for 2 years controlled by diet & medication. Pt stated [place] Pt has gained 28.5# @ 36w, which is > expected (pt was not 100% sure on pregravid wt). Pt reports eating 2-3 meals & 1-2 snacks/d. Pt is not taking a PNV. Pt reports no N&V but has some heartburn. NKFA. Pt reports no physical activity or walking. Pt received verbal & written education in Spanish via interpreter about DM diet during pregnancy. Encouraged PNV. Encouraged ~30 mins of walking/d. Discussed wt gain goals of 15-25# or 0.6#/wk. Pt agrees to follow DM diet with 3 meals & 1-3 snacks/d with proper CHO/ protein combination. Pt does not have WIC. Pt plans to BF. F/u in 1-2 wks Blondell RevealLaura Imanni Burdine, MS, RD, LDN, [facility]

## 2014-08-12 NOTE — Progress Notes (Signed)
Subjective:    [name] is a G1P0 91w0dby LMP and ~4 month [place] seen today for her first obstetrical visit. Some PNC this [place] in [place] but only one page record of Insulin regimen. States she has had [place] but can't recall details. Pt poor historian.  Her obstetrical history is significant for Pre-existing DM in Insulin.. Patient does intend to breast feed. Pregnancy history fully reviewed.  Patient reports no bleeding, no contractions and no leaking.  Filed Vitals:   08/12/14 0955  BP: 120/77  Pulse: 68  Temp: 98 F (36.7 C)  Weight: 168 lb 8 oz (76.431 kg)    HISTORY: OB History  Gravida Para Term Preterm AB SAB TAB Ectopic Multiple Living  1             # Outcome Date GA Lbr Len/2nd Weight Sex Delivery Anes PTL Lv  1 Current              Past Medical History  Diagnosis Date  . Diabetes mellitus without complication   . Gestational diabetes    Past Surgical History  Procedure Laterality Date  . No past surgeries     Family History  Problem Relation Age of Onset  . Depression Mother      Exam    Uterus:   Fundal height 36 cm. Pos FHT's by doppler.  Pelvic Exam:    Perineum: No Hemorrhoids   Vulva: 3 mm NT, flat, open lesion on right labia minora   Vagina:  normal mucosa, normal discharge   pH: NA   Cervix: no bleeding following Pap, no cervical motion tenderness and nulliparous appearance   Adnexa: normal adnexa and no mass, fullness, tenderness   Bony Pelvis: average  System: Breast:  normal appearance, no masses or tenderness, No nipple retraction or dimpling, Taught monthly breast self examination   Skin: normal coloration and turgor, no rashes    Neurologic: oriented, normal mood, gait normal; reflexes normal and symmetric, grossly non-focal   Extremities: normal strength, tone, and muscle mass, no edema   HEENT sclera clear, anicteric   Mouth/Teeth mucous membranes moist, pharynx normal without lesions   Neck  supple and no masses   Cardiovascular: regular rate and rhythm, no murmurs or gallops   Respiratory:  appears well, vitals normal, no respiratory distress, acyanotic, normal RR, chest clear, no wheezing, crepitations, rhonchi, normal symmetric air entry   Abdomen: soft, non-tender; bowel sounds normal; no masses,  no organomegaly and Gravid, S=D   Urinary: urethral meatus normal      Assessment:    Pregnancy: G1P0 1. Late prenatal care affecting pregnancy in third trimester   - Prenatal Profile - Prescript Monitor Profile(19) - Culture, beta strep (group b only) - [place] Comp + 14 Wk; Future - Culture, OB Urine - Hemoglobinopathy evaluation - Comp Met (CMET) - [place]etal Echocardiography; Future - aspirin EC 81 MG tablet; Take 1 tablet (81 mg total) by mouth daily. Take after 12 weeks for prevention of preeclampssia later in pregnancy  Dispense: 100 tablet; Refill: 1 - Fetal nonstress test - Cytology - PAP  2. Need for Tdap vaccination  - Tdap (BOOSTRIX) injection 0.5 mL; Inject 0.5 mLs into the muscle once.  3. Diabetes mellitus complicating pregnancy, antepartum, third trimester  - Comp Met (CMET) - [place]etal Echocardiography; Future - aspirin EC 81 MG tablet; Take 1 tablet (81 mg total) by mouth daily. Take after 12 weeks for prevention of  preeclampssia later in pregnancy  Dispense: 100 tablet; Refill: 1 - Hemoglobin A1c - TSH - insulin NPH Human (HUMULIN N,NOVOLIN N) 100 UNIT/ML injection; 26 Units with breakfast and 8 Units at bedtime.  Dispense: 10 mL; Refill: 3 - Fetal nonstress test - US Fetal BPP W/O Non Stress; Future - Cytology - PAP  4. Supervision of other high risk pregnancy, antepartum, third trimester   5. Elevated blood pressure  - Creatinine Clearance, Urine, 24 hour - Protein, Urine, 24 hour  6. Vulvar lesion  - Herpes simplex virus culture    Plan:     Initial labs drawn. Prenatal vitamins. Problem list reviewed and updated. Genetic  Screening discussed Quad Screen: too late.  Ultrasound discussed; fetal survey: ordered.  Follow up in 1 weeks. Insulin and supply refill. Continue current regimen (atypical for when is normally used in [place]) Fetal Echo Optho Baseline 24 hour Urine GBS Pap Rx Insulin and supplies  DM educator and nutritionist.    Medication List       This list is accurate as of: 08/12/14 11:59 PM.  Always use your most recent med list.               aspirin EC 81 MG tablet  Take 1 tablet (81 mg total) by mouth daily. Take after 12 weeks for prevention of preeclampssia later in pregnancy     insulin aspart 100 UNIT/ML injection  Commonly known as:  novoLOG  7 Units with Breakfast and 6 Units at dinner     insulin NPH Human 100 UNIT/ML injection  Commonly known as:  HUMULIN N,NOVOLIN N  26 Units with breakfast and 8 Units at bedtime.       Manya Silvas 08/12/2014

## 2014-08-12 NOTE — Progress Notes (Signed)
Initial OB new OB from [place], diabetic Maretta LosBlanca Lindner Spanish Interpreter Breastfeeding tip of the week reviewed Pt states no insulin for the past two days, has not been checking blood sugars for 2 week New OB labs Cultures Growth ultrasound Tdap vaccine given 08/12/14

## 2014-08-12 NOTE — Progress Notes (Signed)
  Patient was seen on 08/12/14 for Gestational Diabetes self-management . The following learning objectives were met by the patient :   States the definition of Gestational Diabetes  States why dietary management is important in controlling blood glucose  States when to check blood glucose levels  Demonstrates proper blood glucose monitoring techniques  States the effect of stress and exercise on blood glucose levels  States the importance of limiting caffeine and abstaining from alcohol and smoking  Plan:  Consider  increasing your activity level by walking daily as tolerated Begin checking BG before breakfast and 2 hours after first bit of breakfast, lunch and dinner after  as directed by MD  Take medication  as directed by MD RX given for 8:00AM NPH 26u / R 7 units                      5:00PM                  R 8-units                      10:00PM NPH 10u   Blood glucose monitor given: TrueTrack Lot # [id number] Exp: 2016/08/24 Blood glucose reading: 78 FBS   Patient instructed to monitor glucose levels: FBS: 60 - <90 2 hour: <120  Patient received the following handouts:  Nutrition Diabetes and Pregnancy  Carbohydrate Counting List  Meal Planning worksheet  I will see patient next week for review of glucose readings

## 2014-08-12 NOTE — Progress Notes (Signed)
Ultrasound scheduled for 08/20/2014 @ 1:15 PM Opthalmology appointment [facility]13[facility]2014 [facility]. Fetal echo scheduled for 08/26/2014@10 :30AM with Dr Elizebeth Brookingotton (first available).

## 2014-08-13 ENCOUNTER — Telehealth: Payer: Self-pay | Admitting: *Deleted

## 2014-08-13 LAB — PRENATAL PROFILE (SOLSTAS)
Antibody Screen: NEGATIVE
BASOS PCT: 0 % (ref 0–1)
Basophils Absolute: 0 10*3/uL (ref 0.0–0.1)
EOS ABS: 0.1 10*3/uL (ref 0.0–0.7)
EOS PCT: 1 % (ref 0–5)
HCT: 41.6 % (ref 36.0–46.0)
HIV 1&2 Ab, 4th Generation: NONREACTIVE
Hemoglobin: 14.1 g/dL (ref 12.0–15.0)
Hepatitis B Surface Ag: NEGATIVE
LYMPHS ABS: 2.2 10*3/uL (ref 0.7–4.0)
LYMPHS PCT: 23 % (ref 12–46)
MCH: 29.7 pg (ref 26.0–34.0)
MCHC: 33.9 g/dL (ref 30.0–36.0)
MCV: 87.8 fL (ref 78.0–100.0)
MPV: 10.8 fL (ref 8.6–12.4)
Monocytes Absolute: 0.6 10*3/uL (ref 0.1–1.0)
Monocytes Relative: 6 % (ref 3–12)
Neutro Abs: 6.6 10*3/uL (ref 1.7–7.7)
Neutrophils Relative %: 70 % (ref 43–77)
Platelets: 203 10*3/uL (ref 150–400)
RBC: 4.74 MIL/uL (ref 3.87–5.11)
RDW: 14.4 % (ref 11.5–15.5)
Rh Type: POSITIVE
Rubella: 10.2 Index — ABNORMAL HIGH (ref <0.90)
WBC: 9.4 10*3/uL (ref 4.0–10.5)

## 2014-08-13 NOTE — Unmapped (Signed)
Pt has missed dapp and ANFS appointments the past two weeks. All communication done with [employer]. Pt stated she has had no transportation to her appointments. Reminded pt of scheduled appointments on 08/15/14 and stressed the importance of attending all scheduled appointments until delivery. Pt verbalized understanding. Pt stated she will probably be able to find a way to her appointments on 08/15/14.  Pt denies any questions or concerns. Encouraged pt to call with questions.

## 2014-08-13 NOTE — Telephone Encounter (Signed)
Patient called and stated that Humulin is too expensive. I called [facility] to see what her options are and tech told me that they have a [facility] brand of Novolin called Relion. Rx switched to this with same directions.

## 2014-08-14 ENCOUNTER — Other Ambulatory Visit: Payer: Self-pay | Admitting: General Practice

## 2014-08-14 ENCOUNTER — Telehealth: Payer: Self-pay | Admitting: General Practice

## 2014-08-14 DIAGNOSIS — O24813 Other pre-existing diabetes mellitus in pregnancy, third trimester: Secondary | ICD-10-CM

## 2014-08-14 DIAGNOSIS — O24913 Unspecified diabetes mellitus in pregnancy, third trimester: Secondary | ICD-10-CM

## 2014-08-14 LAB — HEMOGLOBINOPATHY EVALUATION
Hemoglobin Other: 0 %
Hgb A2 Quant: 2.8 % (ref 2.2–3.2)
Hgb A: 97.2 % (ref 96.8–97.8)
Hgb F Quant: 0 % (ref 0.0–2.0)
Hgb S Quant: 0 %

## 2014-08-14 LAB — PRESCRIPTION MONITORING PROFILE (19 PANEL)
Amphetamine/Meth: NEGATIVE ng/mL
BENZODIAZEPINE SCREEN, URINE: NEGATIVE ng/mL
Barbiturate Screen, Urine: NEGATIVE ng/mL
Buprenorphine, Urine: NEGATIVE ng/mL
CARISOPRODOL, URINE: NEGATIVE ng/mL
COCAINE METABOLITES: NEGATIVE ng/mL
Cannabinoid Scrn, Ur: NEGATIVE ng/mL
Creatinine, Urine: 151.7 mg/dL (ref >20.0)
Fentanyl, Ur: NEGATIVE ng/mL
MDMA URINE: NEGATIVE ng/mL
MEPERIDINE UR: NEGATIVE ng/mL
METHAQUALONE SCREEN (URINE): NEGATIVE ng/mL
Methadone Screen, Urine: NEGATIVE ng/mL
NITRITES URINE, INITIAL: NEGATIVE ug/mL
OXYCODONE SCRN UR: NEGATIVE ng/mL
Opiate Screen, Urine: NEGATIVE ng/mL
PH URINE, INITIAL: 6.2 pH (ref 4.5–8.9)
PHENCYCLIDINE, UR: NEGATIVE ng/mL
Propoxyphene: NEGATIVE ng/mL
TAPENTADOLUR: NEGATIVE ng/mL
Tramadol Scrn, Ur: NEGATIVE ng/mL
ZOLPIDEM, URINE: NEGATIVE ng/mL

## 2014-08-14 LAB — CYTOLOGY - PAP

## 2014-08-14 LAB — CULTURE, OB URINE
COLONY COUNT: NO GROWTH
ORGANISM ID, BACTERIA: NO GROWTH

## 2014-08-14 LAB — HERPES SIMPLEX VIRUS CULTURE: Organism ID, Bacteria: NOT DETECTED

## 2014-08-14 LAB — CULTURE, BETA STREP (GROUP B ONLY)

## 2014-08-14 MED ORDER — "INSULIN SYRINGE-NEEDLE U-100 31G X 5/16"" 0.3 ML MISC": 1.0000 | Freq: Three times a day (TID) | Status: DC

## 2014-08-14 MED ORDER — INSULIN REGULAR HUMAN 100 UNIT/ML IJ SOLN: INTRAMUSCULAR | Status: DC

## 2014-08-14 MED ORDER — INSULIN REGULAR HUMAN 100 UNIT/ML IJ SOLN
6.0000 [IU] | Freq: Two times a day (BID) | INTRAMUSCULAR | Status: DC
Start: 2014-08-14 — End: 2014-08-14

## 2014-08-14 MED ORDER — INSULIN REGULAR HUMAN 100 UNIT/ML IJ SOLN: 6.0000 [IU] | Freq: Two times a day (BID) | INTRAMUSCULAR | Status: DC

## 2014-08-14 NOTE — Telephone Encounter (Signed)
Patient called into clinic speaking with Alanda AmassBeronica stating her novolog insulin is over $200. Called patient and verified a cheaper regular insulin and switched Rx. Informed patient of updated cheaper medication. Patient verbalized understanding and had no questions

## 2014-08-15 ENCOUNTER — Ambulatory Visit

## 2014-08-15 LAB — GC/CHLAMYDIA PROBE AMP
CT Probe RNA: NEGATIVE
GC PROBE AMP APTIMA: NEGATIVE

## 2014-08-16 ENCOUNTER — Ambulatory Visit (INDEPENDENT_AMBULATORY_CARE_PROVIDER_SITE_OTHER): Payer: Self-pay | Admitting: *Deleted

## 2014-08-16 VITALS — BP 117/71 | HR 67

## 2014-08-16 DIAGNOSIS — O24913 Unspecified diabetes mellitus in pregnancy, third trimester: Secondary | ICD-10-CM

## 2014-08-16 LAB — COMPREHENSIVE METABOLIC PANEL
ALBUMIN: 3 g/dL — AB (ref 3.5–5.2)
ALT: 12 U/L (ref 0–35)
AST: 15 U/L (ref 0–37)
Alkaline Phosphatase: 175 U/L — ABNORMAL HIGH (ref 39–117)
BUN: 16 mg/dL (ref 6–23)
CALCIUM: 8.8 mg/dL (ref 8.4–10.5)
CO2: 19 meq/L (ref 19–32)
CREATININE: 0.59 mg/dL (ref 0.50–1.10)
Chloride: 101 mEq/L (ref 96–112)
Glucose, Bld: 170 mg/dL — ABNORMAL HIGH (ref 70–99)
POTASSIUM: 3.8 meq/L (ref 3.5–5.3)
SODIUM: 133 meq/L — AB (ref 135–145)
TOTAL PROTEIN: 5.7 g/dL — AB (ref 6.0–8.3)
Total Bilirubin: 0.4 mg/dL (ref 0.2–1.2)

## 2014-08-16 LAB — HEMOGLOBIN A1C
Hgb A1c MFr Bld: 6.2 % — ABNORMAL HIGH (ref <5.7)
Mean Plasma Glucose: 131 mg/dL — ABNORMAL HIGH (ref <117)

## 2014-08-19 ENCOUNTER — Ambulatory Visit (INDEPENDENT_AMBULATORY_CARE_PROVIDER_SITE_OTHER): Payer: Self-pay | Admitting: Obstetrics & Gynecology

## 2014-08-19 ENCOUNTER — Ambulatory Visit

## 2014-08-19 VITALS — BP 121/84 | HR 68 | Temp 98.1°F | Wt 172.6 lb

## 2014-08-19 DIAGNOSIS — O24813 Other pre-existing diabetes mellitus in pregnancy, third trimester: Secondary | ICD-10-CM

## 2014-08-19 DIAGNOSIS — O24913 Unspecified diabetes mellitus in pregnancy, third trimester: Secondary | ICD-10-CM

## 2014-08-19 DIAGNOSIS — O09893 Supervision of other high risk pregnancies, third trimester: Secondary | ICD-10-CM

## 2014-08-19 LAB — CREATININE CLEARANCE, URINE, 24 HOUR
CREATININE, URINE: 36.5 mg/dL
Creatinine Clearance: 149 mL/min — ABNORMAL HIGH (ref 75–115)
Creatinine, 24H Ur: 1268 mg/d (ref 700–1800)
Creatinine: 0.59 mg/dL (ref 0.50–1.10)

## 2014-08-19 LAB — POCT URINALYSIS DIP (DEVICE)
BILIRUBIN URINE: NEGATIVE
Glucose, UA: NEGATIVE mg/dL
HGB URINE DIPSTICK: NEGATIVE
Ketones, ur: NEGATIVE mg/dL
Nitrite: NEGATIVE
Protein, ur: 30 mg/dL — AB
Specific Gravity, Urine: 1.02 (ref 1.005–1.030)
Urobilinogen, UA: 1 mg/dL (ref 0.0–1.0)
pH: 6.5 (ref 5.0–8.0)

## 2014-08-19 LAB — PROTEIN, URINE, 24 HOUR
PROTEIN, URINE: 5 mg/dL (ref 5–24)
Protein, 24H Urine: 174 mg/d — ABNORMAL HIGH (ref <150)

## 2014-08-19 MED ORDER — INSULIN REGULAR HUMAN 100 UNIT/ML IJ SOLN: INTRAMUSCULAR | Status: DC

## 2014-08-19 NOTE — Progress Notes (Signed)
Subjective:  [name] is a 29 y.o. G1P0 at [redacted]w[redacted]d being seen today for ongoing prenatal care.  Patient reports no complaints.  Contractions: Irritability.  Vag. Bleeding: None. Movement: Present. Denies leaking of fluid.   The following portions of the patient's history were reviewed and updated as appropriate: allergies, current medications, past family history, past medical history, past social history, past surgical history and problem list.   Objective:   Filed Vitals:   08/19/14 1004  BP: 121/84  Pulse: 68  Temp: 98.1 F (36.7 C)  Weight: 172 lb 9.6 oz (78.291 kg)    Fetal Status: Fetal Heart Rate (bpm): 155   Movement: Present     General:  Alert, oriented and cooperative. Patient is in no acute distress.  Skin: Skin is warm and dry. No rash noted.   Cardiovascular: Normal heart rate noted  Respiratory: Normal respiratory effort, no problems with respiration noted  Abdomen: Soft, gravid, appropriate for gestational age. Pain/Pressure: Present     Vaginal: Vag. Bleeding: None.    Vag D/C Character: White  Cervix: Not evaluated        Extremities: Normal range of motion.  Edema: Trace  Mental Status: Normal mood and affect. Normal behavior. Normal judgment and thought content.   Urinalysis: Urine Protein: 1+ Urine Glucose: Negative  Assessment and Plan:  Pregnancy: G1P0 at [redacted]w[redacted]d  1. Supervision of other high risk pregnancy, antepartum, third trimester GDM PP dinner are increased.  Will increase regular insulin to 10 untis before dinner.  Fastings and pp breakfast and lunch are WNL. Term labor symptoms and general obstetric precautions including but not limited to vaginal bleeding, contractions, leaking of fluid and fetal movement were reviewed in detail with the patient. Please refer to After Visit Summary for other counseling recommendations.  -Continue 2x weekly testing -Induction at 39 weeks -Patient has 2 medical records and charts need to monitored Pt had  NSVD of 8 pound 14.5 ounce child with no shoulder dystocia Pt has [place] for EFW before delivery.  Return in about 1 week (around 08/26/2014).   Lesly Dukes, MD

## 2014-08-19 NOTE — Progress Notes (Signed)
[name] used for interpreter Reviewed tip of week with patient  

## 2014-08-19 NOTE — Patient Instructions (Signed)
Colocacin de un dispositivo intrauterino (Intrauterine Device Insertion) El dispositivo intrauterino (DIU) se inserta en el tero para Therapist, occupational. [employer] tipos de DIU:  DIU de cobre: este tipo de DIU crea un ambiente desfavorable para la supervivencia de los espermatozoides. El mecanismo de accin no se conoce con Media planner. Puede permanecer colocado durante 10 aos.  DIU hormonal: este tipo de DIU contiene la hormona progestina (progesterona sinttica). La progestina espesa el moco cervical y evita que los espermatozoides ingresen al tero y tambin afina la membrana que tapiza interiormente al tero. No hay evidencias de que el DIU hormonal evite la implantacin. Un DIU hormonal puede dejarse colocado durante un mximo de 66aos, y un DIU hormonal diferente, durante un mximo de 3aos. Es el mtodo anticonceptivo de mejor relacin entre el costo y la efectividad si se deja colocado todo el tiempo de su duracin. Se puede retirar en cualquier momento. INFORME A SU MDICO:  Cualquier alergia que tenga.  Todos los [employer] [place], incluyendo vitaminas, hierbas, gotas oftlmicas, cremas y medicamentos de venta libre.  Problemas previos que usted o los [employer] de su familia hayan tenido con el uso de anestsicos.  Enfermedades de [employer].  Cirugas previas.  Posible embarazo.  Enfermedades patolgicas. RIESGOS Y COMPLICACIONES  Generalmente, la colocacin de un dispositivo intrauterino es un procedimiento seguro. Sin embargo, Games developer procedimiento, pueden surgir complicaciones. Las complicaciones posibles son:  Pinchazo accidental (perforacin) del tero.  La colocacin accidental del DIU en la capa muscular del tero (miometrio) o fuera del tero. Si esto sucede, el DIU puede quedar flotando alrededor [employer] intestinos y debe extraerse quirrgicamente.  El DIU puede salirse del tero (expulsin). Esto es ms frecuente en las mujeres que han dado a luz  recientemente.  Embarazo en la trompa de [place] (ectpico).  Enfermedad plvica inflamatoria (EPI), una infeccin del tero y las trompas de [place]. Mayor riesgo de tener EPI durante los primeros 20das despus de la colocacin del DIU; sin embargo, el riesgo general sigue siendo muy bajo. ANTES DEL PROCEDIMIENTO  Programe la insercin del DIU para cuando tenga el perodo menstrual o inmediatamente despus, para asegurarse de que no est embarazada. La colocacin del DIU se tolera mejor poco despus del ciclo menstrual.  Podra necesitar anlisis o ser examinada para asegurarse de que no est embarazada.  Es posible que tenga que hacerse un test de [place].  Podrn indicarle anlisis para descartar enfermedades de transmisin sexual (ETS) antes de Programmer, systems. Colocar un DIU a una mujer que tiene una infeccin puede hacer que esta empeore.  Podrn indicarle que tome un analgsico 1 o 2 horas antes del procedimiento.  Se realiza un examen para determinar el tamao y la posicin del tero.  Consulte a su mdico si debe cambiar o suspender los medicamentos que toma habitualmente. PROCEDIMIENTO   Se coloca un instrumento (espculo) en la vagina que le permite a su mdico observar la parte inferior del tero (cuello del tero).  El cuello del tero se prepara con un medicamento que disminuye el riesgo de infeccin.  Tal vez le apliquen un medicamento para adormecer cada lado del cuello del tero (bloqueo intracervical o paracervical). Esto se realiza para evitar y Engineer, maintenance con la insercin.  Se insertar un instrumento (sonda uterina) en el tero para determinar la longitud de la cavidad uterina y la direccin hacia la cual el tero puede inclinarse.  Se coloca un dispositivo delgado (insertador del DIU) a travs del canal del  cuello del tero hasta el tero.  El DIU se coloca en la cavidad uterina y el dispositivo de insercin se retira.  Se recorta el hilo de  nylon que est unido al DIU y que se utiliza para su extraccin final. Se recorta de forma que quede en la vagina, apenas afuera del cuello uterino. DESPUS DEL PROCEDIMIENTO  Podr tener sangrado despus del procedimiento. Esto es normal. Vara desde un sangrado ligero durante un par de das hasta un sangrado similar al menstrual.  Podr sentir clicos leves. Document Released: 10/06/2011 Document Revised: 11/01/2012 ExitCare Patient Information 2015 Exit[employer]. This information is not intended to replace advice given to you by your health care provider. Make sure you discuss any questions you have with your health care provider.  

## 2014-08-20 ENCOUNTER — Ambulatory Visit (HOSPITAL_COMMUNITY)
Admission: RE | Admit: 2014-08-20 | Discharge: 2014-08-20 | Disposition: A | Discharge status: Home / Self Care | Payer: Self-pay | Source: Ambulatory Visit | Attending: Advanced Practice Midwife | Admitting: Advanced Practice Midwife

## 2014-08-20 DIAGNOSIS — O0933 Supervision of pregnancy with insufficient antenatal care, third trimester: Secondary | ICD-10-CM | POA: Insufficient documentation

## 2014-08-20 DIAGNOSIS — O09893 Supervision of other high risk pregnancies, third trimester: Secondary | ICD-10-CM

## 2014-08-20 DIAGNOSIS — O3663X0 Maternal care for excessive fetal growth, third trimester, not applicable or unspecified: Secondary | ICD-10-CM | POA: Insufficient documentation

## 2014-08-20 DIAGNOSIS — O24913 Unspecified diabetes mellitus in pregnancy, third trimester: Secondary | ICD-10-CM | POA: Insufficient documentation

## 2014-08-20 DIAGNOSIS — O3663X1 Maternal care for excessive fetal growth, third trimester, fetus 1: Secondary | ICD-10-CM

## 2014-08-20 DIAGNOSIS — Z3A37 37 weeks gestation of pregnancy: Secondary | ICD-10-CM | POA: Insufficient documentation

## 2014-08-22 ENCOUNTER — Ambulatory Visit

## 2014-08-26 ENCOUNTER — Other Ambulatory Visit: Payer: Self-pay

## 2014-08-26 ENCOUNTER — Ambulatory Visit

## 2014-08-27 ENCOUNTER — Ambulatory Visit (INDEPENDENT_AMBULATORY_CARE_PROVIDER_SITE_OTHER): Payer: Self-pay | Admitting: Obstetrics and Gynecology

## 2014-08-27 VITALS — BP 114/77 | HR 74 | Wt 176.1 lb

## 2014-08-27 DIAGNOSIS — O09893 Supervision of other high risk pregnancies, third trimester: Secondary | ICD-10-CM

## 2014-08-27 DIAGNOSIS — O0933 Supervision of pregnancy with insufficient antenatal care, third trimester: Secondary | ICD-10-CM

## 2014-08-27 DIAGNOSIS — O24813 Other pre-existing diabetes mellitus in pregnancy, third trimester: Secondary | ICD-10-CM

## 2014-08-27 DIAGNOSIS — O24913 Unspecified diabetes mellitus in pregnancy, third trimester: Secondary | ICD-10-CM

## 2014-08-27 DIAGNOSIS — O3663X1 Maternal care for excessive fetal growth, third trimester, fetus 1: Secondary | ICD-10-CM

## 2014-08-27 LAB — POCT URINALYSIS DIP (DEVICE)
GLUCOSE, UA: NEGATIVE mg/dL
KETONES UR: NEGATIVE mg/dL
NITRITE: NEGATIVE
PH: 6 (ref 5.0–8.0)
Protein, ur: 30 mg/dL — AB
Specific Gravity, Urine: >=1.03 (ref 1.005–1.030)
UROBILINOGEN UA: 0.2 mg/dL (ref 0.0–1.0)

## 2014-08-27 NOTE — Unmapped (Signed)
All communication done with [employer]. Pt has not been seen in dapp since 07/25/14. LVMM reminding pt of scheduled appointments on 08/29/14. Stressed the importance of coming to these appointments for her health and the health of the baby. Encouraged pt to return this RN's call, number provided.

## 2014-08-27 NOTE — Progress Notes (Signed)
Interpreter Maretta Los present for encounter.   Breastfeeding tip of the week reviewed.  IOL scheduled 8/8 @ 0630.

## 2014-08-27 NOTE — Progress Notes (Signed)
Subjective:  [name] is a 29 y.o. G1P0 at [redacted]w[redacted]d being seen today for ongoing prenatal care.  Patient reports no complaints.  Contractions: Irregular.  Vag. Bleeding: None. Movement: Present. Denies leaking of fluid.   The following portions of the patient's history were reviewed and updated as appropriate: allergies, current medications, past family history, past medical history, past social history, past surgical history and problem list.   Objective:   Filed Vitals:   08/27/14 0932  BP: 114/77  Pulse: 74  Weight: 176 lb 1.6 oz (79.878 kg)    Fetal Status: Fetal Heart Rate (bpm): NST   Movement: Present     General:  Alert, oriented and cooperative. Patient is in no acute distress.  Skin: Skin is warm and dry. No rash noted.   Cardiovascular: Normal heart rate noted  Respiratory: Normal respiratory effort, no problems with respiration noted  Abdomen: Soft, gravid, appropriate for gestational age. Pain/Pressure: Present     Vaginal: Vag. Bleeding: None.       Cervix: Not evaluated        Extremities: Normal range of motion.  Edema: Mild pitting, slight indentation  Mental Status: Normal mood and affect. Normal behavior. Normal judgment and thought content.   Urinalysis: Urine Protein: 1+ Urine Glucose: Negative  Assessment and Plan:  Pregnancy: G1P0 at [redacted]w[redacted]d  1. Diabetes mellitus complicating pregnancy, antepartum, third trimester - reactive nst, afi 14.6 - fasting and breastfast post-prandials wnl, half of evening post-prandials are above goal so am increasing dinner mealtime insulin from 10 to 13 units - repeat growth scan scheduling for this Friday - IOL scheduling for 8/8 when will be 39+0  Term labor symptoms and general obstetric precautions including but not limited to vaginal bleeding, contractions, leaking of fluid and fetal movement were reviewed in detail with the patient. Please refer to After Visit Summary for other counseling recommendations.  Return in  about 3 days (around 08/30/2014) for NST as scheduled.   Kathrynn Running, MD

## 2014-08-29 ENCOUNTER — Telehealth (HOSPITAL_COMMUNITY): Payer: Self-pay | Admitting: *Deleted

## 2014-08-29 ENCOUNTER — Ambulatory Visit

## 2014-08-29 NOTE — Telephone Encounter (Signed)
Preadmission screen Interpreter number [phone]

## 2014-08-30 ENCOUNTER — Ambulatory Visit (HOSPITAL_COMMUNITY)
Admission: RE | Admit: 2014-08-30 | Discharge: 2014-08-30 | Disposition: A | Discharge status: Home / Self Care | Payer: Self-pay | Source: Ambulatory Visit | Attending: Obstetrics and Gynecology | Admitting: Obstetrics and Gynecology

## 2014-08-30 ENCOUNTER — Other Ambulatory Visit: Payer: Self-pay

## 2014-08-30 DIAGNOSIS — O24913 Unspecified diabetes mellitus in pregnancy, third trimester: Secondary | ICD-10-CM | POA: Insufficient documentation

## 2014-08-30 NOTE — Unmapped (Signed)
Pt is 38+[redacted] weeks pregnant who missed another dapp appointment and has not been seen since 07/25/14. LVMM instructing pt to return this RN's call asap, number provided. Will discuss situation with Dava Najjar, social work.

## 2014-08-30 NOTE — Unmapped (Signed)
All communication done with [employer]. Spoke with pt's husband who stated the pt is in [place] and has been for the past 4 weeks. Pt's husband stated pt will be delivering the baby in [place].

## 2014-09-01 NOTE — H&P (Signed)
[name] is a 29 y.o. female G2P1 [redacted]w[redacted]d dated by LMP presenting for IOL 2/2 A2/B DM controlled with insulin. Has had some ctx infrequently throughout the last few weeks of pregnancy, beginning again this morning. Denies LOF, vaginal bleeding, and vaginal discharge.   Sono (8/5): [redacted]w[redacted]d, EFW 4241g, >90%, cephalic presentation   Maternal Medical History:  Reason for admission: Nausea.    OB History    Gravida Para Term Preterm AB TAB SAB Ectopic Multiple Living   1              Past Medical History  Diagnosis Date  . Diabetes mellitus without complication   . Gestational diabetes    Past Surgical History  Procedure Laterality Date  . No past surgeries     Family History: family history includes Depression in her mother. Social History:  reports that she has never smoked. She does not have any smokeless tobacco history on file. She reports that she does not drink alcohol or use illicit drugs.   Clinic  [facility] Prenatal Labs  Dating  LMP matches 4 month [place] in [place] Blood type: O/POS/-- (07/18 1650)  O pos  Genetic Screen 1 Screen:    AFP:     Quad: Too late NIPS: Antibody:NEG (07/18 1650)  Anatomic [place] LGA female, 9-4 at 37 weeks. AC>97% Rubella: 10.20 (07/18 1650)  GTT Early:  Failed      Third trimester:  RPR: NON REAC (07/18 1650)   Flu vaccine summer HBsAg: NEGATIVE (07/18 1650)   TDaP vaccine 08/12/14                             Rhogam: NA HIV: NONREACTIVE (07/18 1650)   Baby Food  Breast                                           GBS: Negative   Contraception IUD? [id number] 07/2014  Circumcision girl   Pediatrician ?[facility]--needs to establish care   Support Person Vicente Serene, significant other       Prenatal Transfer Tool  Maternal Diabetes: Yes:  Diabetes Type:  Insulin/Medication controlled Genetic Screening: Declined Maternal Ultrasounds/Referrals: Normal Fetal Ultrasounds or other Referrals:  None Maternal Substance Abuse:  No Significant  Maternal Medications:  Meds include: Other: Insulin Significant Maternal Lab Results:  Lab values include: Group B Strep negative Other Comments:  EFW >90th%tile  Review of Systems  Constitutional: Negative for fever, chills and malaise/fatigue.  HENT: Negative for congestion.   Eyes: Negative for blurred vision and double vision.  Respiratory: Negative for cough and shortness of breath.   Cardiovascular: Negative for chest pain, palpitations, claudication and leg swelling.  Gastrointestinal: Negative for heartburn, nausea, vomiting, abdominal pain, diarrhea and constipation.  Genitourinary: Negative for dysuria and hematuria.  Musculoskeletal: Negative for myalgias and back pain.  Skin: Negative for itching and rash.  Neurological: Negative for dizziness, loss of consciousness and headaches.      Last menstrual period 12/03/2013. Exam Physical Exam  Constitutional: She is oriented to person, place, and time. She appears well-developed and well-nourished. No distress.  HENT:  Head: Normocephalic and atraumatic.  Eyes: Conjunctivae and EOM are normal.  Neck: Normal range of motion. No thyromegaly present.  Cardiovascular: Normal rate, regular rhythm and normal heart sounds.  Exam reveals no gallop and no  friction rub.   No murmur heard. Respiratory: Breath sounds normal. No respiratory distress. She has no wheezes. She has no rales.  GI: Soft. Bowel sounds are normal. She exhibits no distension. There is no tenderness.  Musculoskeletal: Normal range of motion. She exhibits no edema.  Neurological: She is alert and oriented to person, place, and time.  Skin: Skin is warm and dry. No rash noted. No erythema.  Psychiatric: She has a normal mood and affect. Her behavior is normal.   Dilation: 1 Effacement (%): 40 Cervical Position: Posterior Station: -2 Presentation: Vertex Exam by:: Dr. Earlene Plater   FHR: baseline 150, moderate variability, + accels, no decels Toco: q3-6  min Prenatal labs: ABO, Rh: O/POS/-- (07/18 1650) Antibody: NEG (07/18 1650) Rubella: 10.20 (07/18 1650) RPR: NON REAC (07/18 1650)  HBsAg: NEGATIVE (07/18 1650)  HIV: NONREACTIVE (07/18 1650)  GBS: Negative (07/18 0000)   Assessment/Plan: Pt is a 29 yo f  G2P1 at [redacted]w[redacted]d who presents for IOL 2/2 gestational diabetes. EFW is >90th%tile. Previous NSVD of 8lb 14.5oz baby w/o shoulder dystocia. GBS neg. Girl/breast/IUD vs nexplanon?.  #DM Type II:  POC Glucose 110 on admission. 1x dose of 1/2 morning NPH (13 units) with meal; insulin drip when in active labor if BS>120; CBGs q4hr in latent labor then q2 in active.  #standard admission orders in place #will proceed w/ induction by FB #expecting normal progression to vaginal delivery  Marcy Siren, D.O. 09/02/2014, 9:55 AM PGY-1, [facility]  OB fellow attestation: I have seen and examined this patient; I agree with above documentation in the Resident's note.  [name] is a 29 y.o. G2P1001 here for IOL2/2 A2/B GDM  PE: BP 154/91 mmHg  Pulse 56  Temp(Src) 98.7 F (37.1 C) (Oral)  Resp 18  Ht 4\' 10"  (1.473 m)  Wt 178 lb (80.74 kg)  BMI 37.21 kg/m2  LMP 12/03/2013 Gen: calm comfortable, NAD Resp: normal effort, no distress Abd: gravid  ROS, labs, PMH reviewed  Plan: MOF: breast MOC: Unsure, LARC ID: GBS negative FWB: Cat I Labor: IOL 2/2 GDM Pain: Desires epidural in active labor  Federico Flake, MD Family Medicine, OB Fellow 09/02/2014, 12:04 PM

## 2014-09-02 ENCOUNTER — Inpatient Hospital Stay (HOSPITAL_COMMUNITY): Payer: Medicaid Other | Admitting: Anesthesiology

## 2014-09-02 ENCOUNTER — Encounter (HOSPITAL_COMMUNITY): Payer: Self-pay

## 2014-09-02 ENCOUNTER — Inpatient Hospital Stay (HOSPITAL_COMMUNITY)
Admission: RE | Admit: 2014-09-02 | Discharge: 2014-09-05 | DRG: 774 | Disposition: A | Discharge status: Home / Self Care | Payer: Medicaid Other | Source: Ambulatory Visit | Attending: Family Medicine | Admitting: Family Medicine

## 2014-09-02 ENCOUNTER — Ambulatory Visit

## 2014-09-02 VITALS — BP 148/93 | HR 67 | Temp 98.0°F | Resp 18 | Ht <= 58 in | Wt 178.0 lb

## 2014-09-02 DIAGNOSIS — Z8759 Personal history of other complications of pregnancy, childbirth and the puerperium: Secondary | ICD-10-CM | POA: Diagnosis not present

## 2014-09-02 DIAGNOSIS — D649 Anemia, unspecified: Secondary | ICD-10-CM | POA: Diagnosis not present

## 2014-09-02 DIAGNOSIS — Z3A39 39 weeks gestation of pregnancy: Secondary | ICD-10-CM | POA: Diagnosis present

## 2014-09-02 DIAGNOSIS — O24919 Unspecified diabetes mellitus in pregnancy, unspecified trimester: Secondary | ICD-10-CM | POA: Diagnosis present

## 2014-09-02 DIAGNOSIS — O3663X0 Maternal care for excessive fetal growth, third trimester, not applicable or unspecified: Secondary | ICD-10-CM | POA: Diagnosis present

## 2014-09-02 DIAGNOSIS — O9081 Anemia of the puerperium: Secondary | ICD-10-CM | POA: Diagnosis not present

## 2014-09-02 DIAGNOSIS — O0933 Supervision of pregnancy with insufficient antenatal care, third trimester: Secondary | ICD-10-CM

## 2014-09-02 DIAGNOSIS — O09893 Supervision of other high risk pregnancies, third trimester: Secondary | ICD-10-CM

## 2014-09-02 DIAGNOSIS — O3663X1 Maternal care for excessive fetal growth, third trimester, fetus 1: Secondary | ICD-10-CM

## 2014-09-02 DIAGNOSIS — O24424 Gestational diabetes mellitus in childbirth, insulin controlled: Secondary | ICD-10-CM | POA: Diagnosis present

## 2014-09-02 DIAGNOSIS — O24913 Unspecified diabetes mellitus in pregnancy, third trimester: Secondary | ICD-10-CM

## 2014-09-02 LAB — CBC
HCT: 38 % (ref 36.0–46.0)
Hemoglobin: 13.2 g/dL (ref 12.0–15.0)
MCH: 30.2 pg (ref 26.0–34.0)
MCHC: 34.7 g/dL (ref 30.0–36.0)
MCV: 87 fL (ref 78.0–100.0)
Platelets: 145 10*3/uL — ABNORMAL LOW (ref 150–400)
RBC: 4.37 MIL/uL (ref 3.87–5.11)
RDW: 13.3 % (ref 11.5–15.5)
WBC: 7.7 10*3/uL (ref 4.0–10.5)

## 2014-09-02 LAB — TYPE AND SCREEN
ABO/RH(D): O POS
Antibody Screen: NEGATIVE

## 2014-09-02 LAB — GLUCOSE, CAPILLARY
GLUCOSE-CAPILLARY: 110 mg/dL — AB (ref 65–99)
GLUCOSE-CAPILLARY: 149 mg/dL — AB (ref 65–99)
Glucose-Capillary: 73 mg/dL (ref 65–99)
Glucose-Capillary: 164 mg/dL — ABNORMAL HIGH (ref 65–99)
Glucose-Capillary: 158 mg/dL — ABNORMAL HIGH (ref 65–99)

## 2014-09-02 LAB — ABO/RH: ABO/RH(D): O POS

## 2014-09-02 MED ORDER — DIPHENHYDRAMINE HCL 50 MG/ML IJ SOLN: 12.5000 mg | INTRAMUSCULAR | Status: DC | PRN

## 2014-09-02 MED ORDER — LIDOCAINE HCL (PF) 1 % IJ SOLN
INTRAMUSCULAR | Status: DC | PRN
  Administered 2014-09-02 (×2): 8 mL via EPIDURAL

## 2014-09-02 MED ORDER — PHENYLEPHRINE 40 MCG/ML (10ML) SYRINGE FOR IV PUSH (FOR BLOOD PRESSURE SUPPORT)
80.0000 ug | PREFILLED_SYRINGE | INTRAVENOUS | Status: DC | PRN
  Filled 2014-09-02: qty 20
  Filled 2014-09-02: qty 2

## 2014-09-02 MED ORDER — EPHEDRINE 5 MG/ML INJ
10.0000 mg | INTRAVENOUS | Status: DC | PRN
  Filled 2014-09-02: qty 2

## 2014-09-02 MED ORDER — LACTATED RINGERS IV SOLN
500.0000 mL | INTRAVENOUS | Status: DC | PRN
  Administered 2014-09-02 – 2014-09-03 (×3): 500 mL via INTRAVENOUS

## 2014-09-02 MED ORDER — ONDANSETRON HCL 4 MG/2ML IJ SOLN: 4.0000 mg | Freq: Four times a day (QID) | INTRAMUSCULAR | Status: DC | PRN

## 2014-09-02 MED ORDER — OXYTOCIN BOLUS FROM INFUSION: 500.0000 mL | INTRAVENOUS | Status: DC

## 2014-09-02 MED ORDER — LACTATED RINGERS IV SOLN
INTRAVENOUS | Status: DC
  Administered 2014-09-02 – 2014-09-03 (×4): via INTRAVENOUS

## 2014-09-02 MED ORDER — LIDOCAINE HCL (PF) 1 % IJ SOLN
30.0000 mL | INTRAMUSCULAR | Status: DC | PRN
Start: 2014-09-02 — End: 2014-09-03
  Filled 2014-09-02: qty 30

## 2014-09-02 MED ORDER — OXYTOCIN 40 UNITS IN LACTATED RINGERS INFUSION - SIMPLE MED
62.5000 mL/h | INTRAVENOUS | Status: DC
  Administered 2014-09-03: 62.5 mL/h via INTRAVENOUS
  Administered 2014-09-03: 1000 mL/h via INTRAVENOUS
  Filled 2014-09-02: qty 1000

## 2014-09-02 MED ORDER — INSULIN NPH (HUMAN) (ISOPHANE) 100 UNIT/ML ~~LOC~~ SUSP
13.0000 [IU] | Freq: Once | SUBCUTANEOUS | Status: AC
  Administered 2014-09-02: 13 [IU] via SUBCUTANEOUS
  Filled 2014-09-02: qty 10

## 2014-09-02 MED ORDER — OXYCODONE-ACETAMINOPHEN 5-325 MG PO TABS: 2.0000 | ORAL_TABLET | ORAL | Status: DC | PRN

## 2014-09-02 MED ORDER — FLEET ENEMA 7-19 GM/118ML RE ENEM: 1.0000 | ENEMA | RECTAL | Status: DC | PRN

## 2014-09-02 MED ORDER — CITRIC ACID-SODIUM CITRATE 334-500 MG/5ML PO SOLN: 30.0000 mL | ORAL | Status: DC | PRN

## 2014-09-02 MED ORDER — INSULIN ASPART 100 UNIT/ML ~~LOC~~ SOLN: 0.0000 [IU] | SUBCUTANEOUS | Status: DC

## 2014-09-02 MED ORDER — SODIUM CHLORIDE 0.9 % IV SOLN: INTRAVENOUS | Status: DC | PRN

## 2014-09-02 MED ORDER — OXYTOCIN 40 UNITS IN LACTATED RINGERS INFUSION - SIMPLE MED
1.0000 m[IU]/min | INTRAVENOUS | Status: DC
  Administered 2014-09-02: 2 m[IU]/min via INTRAVENOUS
  Filled 2014-09-02: qty 1000

## 2014-09-02 MED ORDER — ACETAMINOPHEN 325 MG PO TABS: 650.0000 mg | ORAL_TABLET | ORAL | Status: DC | PRN

## 2014-09-02 MED ORDER — FENTANYL 2.5 MCG/ML BUPIVACAINE 1/10 % EPIDURAL INFUSION (WH - ANES)
14.0000 mL/h | INTRAMUSCULAR | Status: DC | PRN
  Administered 2014-09-02: 14 mL/h via EPIDURAL
  Filled 2014-09-02: qty 125

## 2014-09-02 MED ORDER — INSULIN REGULAR BOLUS VIA INFUSION: 0.0000 [IU] | Freq: Three times a day (TID) | INTRAVENOUS | Status: DC | PRN

## 2014-09-02 MED ORDER — FENTANYL CITRATE (PF) 100 MCG/2ML IJ SOLN
100.0000 ug | Freq: Once | INTRAMUSCULAR | Status: AC
  Administered 2014-09-02: 100 ug via INTRAVENOUS
  Filled 2014-09-02: qty 2

## 2014-09-02 MED ORDER — OXYCODONE-ACETAMINOPHEN 5-325 MG PO TABS: 1.0000 | ORAL_TABLET | ORAL | Status: DC | PRN

## 2014-09-02 MED ORDER — TERBUTALINE SULFATE 1 MG/ML IJ SOLN
0.2500 mg | Freq: Once | INTRAMUSCULAR | Status: DC | PRN
  Filled 2014-09-02: qty 1

## 2014-09-02 NOTE — Anesthesia Procedure Notes (Signed)
Epidural Patient location during procedure: OB Start time: 09/02/2014 8:28 PM End time: 09/02/2014 8:32 PM  Staffing Anesthesiologist: Leilani Able Performed by: anesthesiologist   Preanesthetic Checklist Completed: patient identified, surgical consent, pre-op evaluation, timeout performed, IV checked, risks and benefits discussed and monitors and equipment checked  Epidural Patient position: sitting Prep: site prepped and draped and DuraPrep Patient monitoring: continuous pulse ox and blood pressure Approach: midline Location: L3-L4 Injection technique: LOR air  Needle:  Needle type: Tuohy  Needle gauge: 17 G Needle length: 9 cm and 9 Needle insertion depth: 6 cm Catheter type: closed end flexible Catheter size: 19 Gauge Catheter at skin depth: 11 cm Test dose: negative and Other  Assessment Sensory level: T9 Events: blood not aspirated, injection not painful, no injection resistance, negative IV test and no paresthesia  Additional Notes Reason for block:procedure for pain

## 2014-09-02 NOTE — Progress Notes (Signed)
Pt c/o feeling shaky with cold sweats- CBG 73, food ordered

## 2014-09-02 NOTE — Progress Notes (Signed)
I stopped to check on patient's need I ordered her meals, also I assisted Dr Arby Barrette with Epidural, by Orlan Leavens Spanish Interpreter.

## 2014-09-02 NOTE — Anesthesia Preprocedure Evaluation (Addendum)
Anesthesia Evaluation  Patient identified by MRN, date of birth, ID band Patient awake    Reviewed: Allergy & Precautions, H&P , NPO status , Patient's Chart, lab work & pertinent test results  Airway Mallampati: II  TM Distance: >3 FB Neck ROM: full    Dental no notable dental hx.    Pulmonary neg pulmonary ROS,    Pulmonary exam normal       Cardiovascular negative cardio ROS Normal cardiovascular exam    Neuro/Psych negative neurological ROS  negative psych ROS   GI/Hepatic negative GI ROS, Neg liver ROS,   Endo/Other  diabetes, Gestational  Renal/GU negative Renal ROS     Musculoskeletal   Abdominal (+) + obese,   Peds  Hematology negative hematology ROS (+)   Anesthesia Other Findings   Reproductive/Obstetrics (+) Pregnancy                            Anesthesia Physical Anesthesia Plan  ASA: III  Anesthesia Plan: Epidural   Post-op Pain Management:    Induction:   Airway Management Planned:   Additional Equipment:   Intra-op Plan:   Post-operative Plan:   Informed Consent: I have reviewed the patients History and Physical, chart, labs and discussed the procedure including the risks, benefits and alternatives for the proposed anesthesia with the patient or authorized representative who has indicated his/her understanding and acceptance.     Plan Discussed with:   Anesthesia Plan Comments:        Anesthesia Quick Evaluation

## 2014-09-02 NOTE — Progress Notes (Signed)
Performed bedside US and infant is confirmed vertex.   Federico Flake, MD

## 2014-09-03 ENCOUNTER — Encounter (HOSPITAL_COMMUNITY): Payer: Self-pay

## 2014-09-03 DIAGNOSIS — O2412 Pre-existing diabetes mellitus, type 2, in childbirth: Secondary | ICD-10-CM

## 2014-09-03 DIAGNOSIS — Z8759 Personal history of other complications of pregnancy, childbirth and the puerperium: Secondary | ICD-10-CM | POA: Diagnosis not present

## 2014-09-03 DIAGNOSIS — Z3A39 39 weeks gestation of pregnancy: Secondary | ICD-10-CM

## 2014-09-03 DIAGNOSIS — E119 Type 2 diabetes mellitus without complications: Secondary | ICD-10-CM

## 2014-09-03 DIAGNOSIS — O0933 Supervision of pregnancy with insufficient antenatal care, third trimester: Secondary | ICD-10-CM

## 2014-09-03 LAB — COMPREHENSIVE METABOLIC PANEL
ALK PHOS: 211 U/L — AB (ref 38–126)
ALT: 11 U/L — ABNORMAL LOW (ref 14–54)
AST: 19 U/L (ref 15–41)
Albumin: 2.2 g/dL — ABNORMAL LOW (ref 3.5–5.0)
Anion gap: 7 (ref 5–15)
BILIRUBIN TOTAL: 0.3 mg/dL (ref 0.3–1.2)
BUN: 15 mg/dL (ref 6–20)
CALCIUM: 8.2 mg/dL — AB (ref 8.9–10.3)
CO2: 19 mmol/L — AB (ref 22–32)
Chloride: 109 mmol/L (ref 101–111)
Creatinine, Ser: 0.68 mg/dL (ref 0.44–1.00)
GFR calc Af Amer: >60 mL/min (ref >60)
GFR calc non Af Amer: >60 mL/min (ref >60)
GLUCOSE: 108 mg/dL — AB (ref 65–99)
POTASSIUM: 4 mmol/L (ref 3.5–5.1)
SODIUM: 135 mmol/L (ref 135–145)
TOTAL PROTEIN: 5 g/dL — AB (ref 6.5–8.1)

## 2014-09-03 LAB — PROTEIN / CREATININE RATIO, URINE
Creatinine, Urine: 81 mg/dL
Protein Creatinine Ratio: 1.81 mg/mg{Cre} — ABNORMAL HIGH (ref 0.00–0.15)
Total Protein, Urine: 147 mg/dL

## 2014-09-03 LAB — HEMOGLOBIN AND HEMATOCRIT, BLOOD
HCT: 32.9 % — ABNORMAL LOW (ref 36.0–46.0)
Hemoglobin: 11.1 g/dL — ABNORMAL LOW (ref 12.0–15.0)

## 2014-09-03 LAB — GLUCOSE, CAPILLARY
GLUCOSE-CAPILLARY: 199 mg/dL — AB (ref 65–99)
Glucose-Capillary: 121 mg/dL — ABNORMAL HIGH (ref 65–99)
Glucose-Capillary: 156 mg/dL — ABNORMAL HIGH (ref 65–99)
Glucose-Capillary: 200 mg/dL — ABNORMAL HIGH (ref 65–99)
Glucose-Capillary: 271 mg/dL — ABNORMAL HIGH (ref 65–99)

## 2014-09-03 LAB — RPR: RPR: NONREACTIVE

## 2014-09-03 MED ORDER — PRENATAL MULTIVITAMIN CH
1.0000 | ORAL_TABLET | Freq: Every day | ORAL | Status: DC
  Administered 2014-09-03 – 2014-09-05 (×3): 1 via ORAL
  Filled 2014-09-03 (×3): qty 1

## 2014-09-03 MED ORDER — MISOPROSTOL 200 MCG PO TABS
1000.0000 ug | ORAL_TABLET | Freq: Once | ORAL | Status: AC
  Administered 2014-09-03: 1000 ug via VAGINAL

## 2014-09-03 MED ORDER — METHYLERGONOVINE MALEATE 0.2 MG/ML IJ SOLN: 0.2000 mg | INTRAMUSCULAR | Status: DC | PRN

## 2014-09-03 MED ORDER — ONDANSETRON HCL 4 MG PO TABS: 4.0000 mg | ORAL_TABLET | ORAL | Status: DC | PRN

## 2014-09-03 MED ORDER — WITCH HAZEL-GLYCERIN EX PADS: 1.0000 "application " | MEDICATED_PAD | CUTANEOUS | Status: DC | PRN

## 2014-09-03 MED ORDER — INSULIN ASPART 100 UNIT/ML ~~LOC~~ SOLN: 0.0000 [IU] | Freq: Every day | SUBCUTANEOUS | Status: DC

## 2014-09-03 MED ORDER — BENZOCAINE-MENTHOL 20-0.5 % EX AERO
1.0000 "application " | INHALATION_SPRAY | CUTANEOUS | Status: DC | PRN
  Administered 2014-09-03: 1 via TOPICAL
  Filled 2014-09-03: qty 56

## 2014-09-03 MED ORDER — ONDANSETRON HCL 4 MG/2ML IJ SOLN: 4.0000 mg | INTRAMUSCULAR | Status: DC | PRN

## 2014-09-03 MED ORDER — LABETALOL HCL 5 MG/ML IV SOLN
10.0000 mg | Freq: Once | INTRAVENOUS | Status: AC
  Administered 2014-09-03: 10 mg via INTRAVENOUS

## 2014-09-03 MED ORDER — IBUPROFEN 600 MG PO TABS
600.0000 mg | ORAL_TABLET | Freq: Four times a day (QID) | ORAL | Status: DC
  Administered 2014-09-03 – 2014-09-05 (×9): 600 mg via ORAL
  Filled 2014-09-03 (×9): qty 1

## 2014-09-03 MED ORDER — DIBUCAINE 1 % RE OINT: 1.0000 "application " | TOPICAL_OINTMENT | RECTAL | Status: DC | PRN

## 2014-09-03 MED ORDER — OXYCODONE-ACETAMINOPHEN 5-325 MG PO TABS: 2.0000 | ORAL_TABLET | ORAL | Status: DC | PRN

## 2014-09-03 MED ORDER — ACETAMINOPHEN 325 MG PO TABS
650.0000 mg | ORAL_TABLET | ORAL | Status: DC | PRN
  Administered 2014-09-03: 650 mg via ORAL
  Filled 2014-09-03: qty 2

## 2014-09-03 MED ORDER — SIMETHICONE 80 MG PO CHEW: 80.0000 mg | CHEWABLE_TABLET | ORAL | Status: DC | PRN

## 2014-09-03 MED ORDER — DOCUSATE SODIUM 100 MG PO CAPS
100.0000 mg | ORAL_CAPSULE | Freq: Two times a day (BID) | ORAL | Status: DC
  Administered 2014-09-03 – 2014-09-05 (×4): 100 mg via ORAL
  Filled 2014-09-03 (×4): qty 1

## 2014-09-03 MED ORDER — METFORMIN HCL 500 MG PO TABS
1000.0000 mg | ORAL_TABLET | Freq: Two times a day (BID) | ORAL | Status: DC
  Administered 2014-09-03 – 2014-09-05 (×4): 1000 mg via ORAL
  Filled 2014-09-03 (×4): qty 2

## 2014-09-03 MED ORDER — MISOPROSTOL 200 MCG PO TABS
ORAL_TABLET | ORAL | Status: AC
  Filled 2014-09-03: qty 5

## 2014-09-03 MED ORDER — METHYLERGONOVINE MALEATE 0.2 MG/ML IJ SOLN
0.2000 mg | INTRAMUSCULAR | Status: DC | PRN
  Administered 2014-09-03: 0.2 mg via INTRAMUSCULAR

## 2014-09-03 MED ORDER — INSULIN GLARGINE 100 UNIT/ML ~~LOC~~ SOLN
20.0000 [IU] | Freq: Every day | SUBCUTANEOUS | Status: DC
  Administered 2014-09-03: 20 [IU] via SUBCUTANEOUS
  Filled 2014-09-03 (×2): qty 0.2

## 2014-09-03 MED ORDER — OXYTOCIN 40 UNITS IN LACTATED RINGERS INFUSION - SIMPLE MED: 62.5000 mL/h | INTRAVENOUS | Status: DC | PRN

## 2014-09-03 MED ORDER — LABETALOL HCL 5 MG/ML IV SOLN
INTRAVENOUS | Status: AC
  Administered 2014-09-03: 10 mg via INTRAVENOUS
  Filled 2014-09-03: qty 4

## 2014-09-03 MED ORDER — DIPHENHYDRAMINE HCL 25 MG PO CAPS
25.0000 mg | ORAL_CAPSULE | Freq: Four times a day (QID) | ORAL | Status: DC | PRN
Start: 2014-09-03 — End: 2014-09-05

## 2014-09-03 MED ORDER — OXYCODONE-ACETAMINOPHEN 5-325 MG PO TABS
1.0000 | ORAL_TABLET | ORAL | Status: DC | PRN
  Administered 2014-09-03 – 2014-09-04 (×3): 1 via ORAL
  Filled 2014-09-03 (×3): qty 1

## 2014-09-03 MED ORDER — INSULIN ASPART 100 UNIT/ML ~~LOC~~ SOLN
0.0000 [IU] | Freq: Three times a day (TID) | SUBCUTANEOUS | Status: DC
  Administered 2014-09-04: 3 [IU] via SUBCUTANEOUS
  Administered 2014-09-04: 2 [IU] via SUBCUTANEOUS

## 2014-09-03 MED ORDER — LANOLIN HYDROUS EX OINT: TOPICAL_OINTMENT | CUTANEOUS | Status: DC | PRN

## 2014-09-03 MED ORDER — MISOPROSTOL 50MCG HALF TABLET: 100.0000 ug | ORAL_TABLET | Freq: Once | ORAL | Status: DC

## 2014-09-03 NOTE — Progress Notes (Signed)
UR chart review completed.  

## 2014-09-03 NOTE — Progress Notes (Signed)
MD notified about blood sugar. New order for metaformin scheduled for 1700

## 2014-09-03 NOTE — Progress Notes (Signed)
MD notified about blood sugar being 272.  Requested Diet to be changed to low carb diet since patient is not eating appropriate meals for type 2 diabetes.  Next blood sugar to be checked at midnight.  Will continue to monitor.

## 2014-09-03 NOTE — Progress Notes (Signed)
Called back to room for continued bleeding. Total EBL since delivery. RN had expressed a 100 cc clot.Risk factors for hemorrhage include macrosomia, multiparous (although not grand multiparity), and pitocin for induction.   BP 130/81 mmHg  Pulse 78  Temp(Src) 98 F (36.7 C) (Oral)  Resp 18  Ht  (1.473 m)  Wt 178 lb (80.74 kg)  BMI 37.21 kg/m2  SpO2 99%  LMP 12/03/2013   BP when I entered was 118/60.  Uterus was firm on my exam but given her bleeding and now her normal BP, I decided to give IM Methergine and provide 1 L bolus of LR.   Continue to monitor for continued bleeding.  Follow up postpartum H/H  Federico Flake, MD

## 2014-09-03 NOTE — Progress Notes (Signed)
Labor Progress Note  S: Feeling well after epidural. Mild pressure.   O:  BP 119/97 mmHg  Pulse 134  Temp(Src) 97.8 F (36.6 C) (Oral)  Resp 18  Ht  (1.473 m)  Wt 178 lb (80.74 kg)  BMI 37.21 kg/m2  SpO2 99%  LMP 12/03/2013 EFM: 145/Mod/+Accelerations/+early decelerations and shallow variables CVE: 7.5/80/0, cervix is mostly on right with bulbous anterior portion Dilation: 7.5 (Cervix predominantly on maternal right from 6-12 o'clock) Effacement (%): 80 Cervical Position: Anterior Station: 0 Presentation: Vertex Exam by:: Breeonna Mone MD   A&P: 29 y.o. G2P1001 [redacted]w[redacted]d - IOL for A2GDM #Labor: progressing normally at this point. Continued cervical change with pitocin.  Placed IUPC to help measure strength of ctx.  #FWB: Cat II- no intervention needed except repositioning, IUPC in place and available for amnioinfusion if appropriate #GBS: Negative  Federico Flake, MD 12:27 AM

## 2014-09-03 NOTE — Progress Notes (Signed)
Progressively elevating BP but patient has adequate pain control.   Filed Vitals:   09/03/14 0000 09/03/14 0034 09/03/14 0043 09/03/14 0046  BP: 119/97 166/99 176/94 166/100  Pulse: 134 61 56 54  Temp:      TempSrc:      Resp:      Height:      Weight:      SpO2:       #Elevated BP without HA -will get CMP and UPC - Labetalol  IV x1 - will consider magnesium if severe range persists  Federico Flake, MD

## 2014-09-03 NOTE — Anesthesia Postprocedure Evaluation (Signed)
  Anesthesia Post-op Note  Patient: [name]  Procedure(s) Performed: * No procedures listed *  Patient Location: PACU and Mother/Baby  Anesthesia Type:Epidural  Level of Consciousness: awake, alert  and oriented  Airway and Oxygen Therapy: Patient Spontanous Breathing  Post-op Pain: mild  Post-op Assessment: Post-op Vital signs reviewed              Post-op Vital Signs: Reviewed and stable  Last Vitals:  Filed Vitals:   09/03/14 0619  BP: 133/86  Pulse: 74  Temp: 36.8 C  Resp: 18    Complications: No apparent anesthesia complications

## 2014-09-04 LAB — GLUCOSE, CAPILLARY
GLUCOSE-CAPILLARY: 170 mg/dL — AB (ref 65–99)
Glucose-Capillary: 107 mg/dL — ABNORMAL HIGH (ref 65–99)
Glucose-Capillary: 148 mg/dL — ABNORMAL HIGH (ref 65–99)
Glucose-Capillary: 167 mg/dL — ABNORMAL HIGH (ref 65–99)
Glucose-Capillary: 141 mg/dL — ABNORMAL HIGH (ref 65–99)

## 2014-09-04 MED ORDER — INSULIN NPH (HUMAN) (ISOPHANE) 100 UNIT/ML ~~LOC~~ SUSP
13.0000 [IU] | Freq: Two times a day (BID) | SUBCUTANEOUS | Status: DC
  Administered 2014-09-04 – 2014-09-05 (×2): 13 [IU] via SUBCUTANEOUS
  Filled 2014-09-04: qty 10

## 2014-09-04 NOTE — Progress Notes (Signed)
Post Partum Day 1 Subjective:  [name] is a 29 y.o. [id number] [redacted]w[redacted]d s/p SVD IOL 2/2 A2gDM.  No acute events overnight.  Pt denies problems with ambulating, voiding or po intake.  She denies nausea or vomiting.  Pain is well controlled.  She has had flatus. She has not had bowel movement.  Lochia Small.  Plan for birth control is IUD.  Method of Feeding: breast  Objective: Blood pressure 144/86, pulse 82, temperature 98 F (36.7 C), temperature source Oral, resp. rate 20, height  (1.473 m), weight 80.74 kg (178 lb), last menstrual period 12/03/2013, SpO2 100 %, unknown if currently breastfeeding.  Physical Exam:  General: alert, cooperative and no distress Lochia:normal flow Chest: CTAB Heart: RRR no m/r/g Abdomen: +BS, soft, nontender,  Uterine Fundus: firm, nontender DVT Evaluation: No evidence of DVT seen on physical exam. Extremities: trace edema   Recent Labs  09/02/14 0902 09/03/14 0704  HGB 13.2 11.1*  HCT 38.0 32.9*    Assessment/Plan:  ASSESSMENT: [name] is a 29 y.o. [id number] [redacted]w[redacted]d s/p SVD; IOL 2/2 A2gDM  On Metformin  bid for blood sugar control. Will also start insulin. Anemia is asymptomatic; will defer tx at this time.  Plan for discharge tomorrow and Breastfeeding   LOS: 2 days   [name] 09/04/2014, 7:00 AM

## 2014-09-04 NOTE — Progress Notes (Signed)
I stopped to check on patient's need I ordered her meals, by Viria Alvarez Spanish Interpreter °

## 2014-09-04 NOTE — Progress Notes (Signed)
Inpatient Diabetes Program Recommendations  AACE/ADA: New Consensus Statement on Inpatient Glycemic Control (2013)  Target Ranges:  Prepandial:   less than 140 mg/dL      Peak postprandial:   less than 180 mg/dL (1-2 hours)      Critically ill patients:  140 - 180 mg/dL   Noted glucose elevated following delivery. A1C at 36 wks documented as 6.2%. Pt now on lantus 20 units started last evening which was effective at fasting glucose control this morning. Moderate correction and HS scale also effective with control at this time. Pt will need follow-up care and continue to check her cbg's at home with insulin regimen-present regimen is good start prior to follow-up. Noted pt is medicaid potential-possibly a candidate to be seen at the [facility] where if she has an appt made prior to d/c, she can get her med at tremendous savings through their program.  Thank you Lenor Coffin, RN, MSN, CDE  Diabetes Inpatient Program Office: [phone] Pager: [phone] 8:00 am to 5:00 pm

## 2014-09-04 NOTE — Clinical Social Work Maternal (Signed)
CLINICAL SOCIAL WORK MATERNAL/CHILD NOTE  Patient Details  Name: [name] MRN: [id number] Date of Birth: 06/09/1985  Date:  2015/01/14  Clinical Social Worker Initiating Note:  Loleta Books, LCSW Date/ Time Initiated:  02/18/2014/1030     Child's Name:  [name]   Legal Guardian:  [name] and Martyn Malay (parents)   Need for Interpreter:  Spanish  Date of Referral:  May 19, 2014     Reason for Referral:  Late or No Prenatal Care    Referral Source:  [facility]   Address:  [address], [place] [address]  Phone number:  [phone]   Household Members:  Minor Children, Friend, Friend's Children, Spouse   Natural Supports (not living in the home):   None identified   Professional Supports: None   Employment:   Stays at home  Type of Work:   N/A  Education:    N/A  Architect:  Self-Pay    Other Resources:  [facility]   Cultural/Religious Considerations Which May Impact Care:  MOB is Spanish-speaking, recent immigrant (2 months ago) from [place].   Strengths:  Ability to meet basic needs , Home prepared for child    Risk Factors/Current Problems:   1) Recent immigrant from [place] 2 months ago. MOB continues to adjust to transition as she adjusts to her transition postpartum.  Cognitive State:  Able to Concentrate , Alert , Goal Oriented , Linear Thinking    Mood/Affect:  Animated, Bright , Calm , Comfortable    CSW Assessment:  CSW received request for consult due to MOB arriving late to prenatal care at 36 weeks.  Assessment completed with assistance of Eda Royal, in-home Spanish interpreter.  MOB presented in a pleasant mood and displayed a full range in affect. MOB denied questions, concerns, or needs as she prepares for her transition postpartum.  She expressed feelings of happiness associated with birth of this infant, and shared that she and the FOB have prepared for the infant's arrival by securing infant's basic needs.  Per  MOB, she and the FOB live with a friend and her children, and discussed feeling well supported.  MOB denied history of mental health symptoms, and denied a history of postpartum depression.  MOB presented as attentive and engaged as CSW provided education on perinatal mood and anxiety disorders, and agreed to contact her medical provider if she notes onset of symptoms.   MOB reported that she and the FOB moved to [place] from [place] about 2 months ago. She discussed desire to give birth to this infant in the [place] since her son was also born in the [place]. MOB shared that she and her family intend to live in [place], and denied any ongoing stressors associated with the recent move.  CSW acknowledged range of emotions that may accompany a move while pregnant, but MOB stated that she has had no mental health concerns or anxieties during the pregnancy.  Per MOB, late prenatal care in [place] was a result of her recent move to [place]. MOB verbalized understanding of the hospital drug screen policy, and denied questions or concerns related to the collection of the infant's urine and meconium.  MOB denied all substance use during the pregnancy.  MOB denied questions, concerns, or needs at this time. She expressed appreciation for the visit, and agreed to contact CSW if needs arise during the admission.   CSW Plan/Description:   1)Patient/Family Education: Perinatal mood and anxiety disorders, hospital drug screen policy 2) CSW  to monitor infant's drug screens, will notify CPS if there is a positive drug screen. 3)Information/Referral to [facility] [employer]   4)No Further Intervention Required/No Barriers to Discharge    Kelby Fam 10-01-2014, 11:55 AM

## 2014-09-04 NOTE — Progress Notes (Signed)
Patient had eaten a snack at 1645 for 1800 CBG and then stated that she had ordered her dinner to come at 1900, so no insulin was given to cover this CBG until her food comes later.

## 2014-09-04 NOTE — Lactation Note (Signed)
This note was copied from the chart of Anita Lucine Bilski. Lactation Consultation Note Follow up visit at 43 hours is spanish interpreter.  Mom is holding asleep baby in blanket.  MOm reports she plans to give breast and formula because she doesn't have milk.  Demonstrated hand expression with colostrum visible and mom returned demo.   Baby awakened, assisted with football hold on left breast.  Baby latched a few times with a few sucks and pulls back to tip of nipple and is sleepy.  Baby only had a few sucks with expressed colostrum to mouth and is asleep STS with mom.  Encouraged frequent breast feedings.  MOm to call for [facility] score overnight.  Mom to call for assist as needed.   Patient Name: [name] [id number] Date: 09/04/2014 Reason for consult: Follow-up assessment   Maternal Data    Feeding Feeding Type: Breast Fed Length of feed: 1 min  LATCH Score/Interventions Latch: Repeated attempts needed to sustain latch, nipple held in mouth throughout feeding, stimulation needed to elicit sucking reflex.  Audible Swallowing: None (drops to mouth)  Type of Nipple: Everted at rest and after stimulation  Comfort (Breast/Nipple): Soft / non-tender     Hold (Positioning): Assistance needed to correctly position infant at breast and maintain latch. Intervention(s): Breastfeeding basics reviewed;Support Pillows;Position options;Skin to skin  LATCH Score: 6  Lactation Tools Discussed/Used     Consult Status Consult Status: Follow-up Date: 09/05/14 Follow-up type: In-patient    [name] 09/04/2014, 9:19 PM

## 2014-09-04 NOTE — Lactation Note (Signed)
This note was copied from the chart of Anita Ragna Kramlich. Lactation Consultation Note  Spanish Interpreter present. 6% weight loss. Mother states she knows how to hand express. Encouraged mother to breastfeed on both breasts and burp in between sides. Reviewed basics, STS and cluster feeding.  Suggest waking baby and undressing her and placing her STS until she cues after 3 hours. Encouraged mother to apply ebm  Mom encouraged to feed baby 8-12 times/24 hours and with feeding cues.  Mom made aware of O/P services, breastfeeding support groups, community resources, and our phone # for post-discharge questions.    Patient Name: [name] Today's Date: 09/04/2014 Reason for consult: Initial assessment   Maternal Data Has patient been taught Hand Expression?: Yes Does the patient have breastfeeding experience prior to this delivery?: No  Feeding Feeding Type: Breast Fed Length of feed: 20 min  LATCH Score/Interventions                      Lactation Tools Discussed/Used     Consult Status Consult Status: Follow-up Date: 09/05/14 Follow-up type: In-patient    Dahlia Byes [facility] 09/04/2014, 12:07 AM

## 2014-09-05 ENCOUNTER — Encounter

## 2014-09-05 ENCOUNTER — Ambulatory Visit

## 2014-09-05 LAB — GLUCOSE, CAPILLARY: GLUCOSE-CAPILLARY: 115 mg/dL — AB (ref 65–99)

## 2014-09-05 MED ORDER — INSULIN NPH (HUMAN) (ISOPHANE) 100 UNIT/ML ~~LOC~~ SUSP: 13.0000 [IU] | Freq: Two times a day (BID) | SUBCUTANEOUS | Status: DC

## 2014-09-05 MED ORDER — IBUPROFEN 600 MG PO TABS: 600.0000 mg | ORAL_TABLET | Freq: Four times a day (QID) | ORAL | Status: DC

## 2014-09-05 NOTE — Discharge Summary (Signed)
Obstetric Discharge Summary Reason for Admission: induction of labor 2/2 Class B gDM Prenatal Procedures: none Intrapartum Procedures: spontaneous vaginal delivery Postpartum Procedures: none Complications-Operative and Postpartum: hemorrhage  Patient is 29 y.o. G2P1001 [redacted]w[redacted]d admitted for IOL 2/2 to B-GDM, hx of preexisting DM (on oral agent only)   Delivery Note At 1:36 AM a viable female was delivered via Vaginal, Spontaneous Delivery (Presentation: ; Occiput Anterior). APGAR: 7, 9; weight 8 lb 14.5 oz (4040 g).  Placenta status: Intact, Spontaneous. Cord: 3 vessels with the following complications: None. Cord pH: Na  Anesthesia: Epidural  Episiotomy: None Lacerations: 1st degree Suture Repair: Not repaired, hemostatic Est. Blood Loss (mL): 454 Patient with greater than expected bleeding. Gave PP pitocin and Cytotec rectally and swept LUS for clot and alleviated approximately 100cc of clot.   Mom to postpartum. Baby to Couplet care / Skin to Skin.  Hospital Course:  Active Problems:   Late prenatal care affecting pregnancy in third trimester   Diabetes mellitus complicating pregnancy, antepartum   Fetal macrosomia during pregnancy in third trimester, antepartum   Normal delivery   Postpartum hemorrhage   [name] is a 29 y.o. [id number] s/p IOL w/ PPH.  Patient was admitted 8/7.  She has postpartum course that was complicated by PPH w/ EBL 900cc. The pt feels ready to go home and  will be discharged with outpatient follow-up.   Today: No acute events overnight.  Pt denies problems with ambulating, voiding or po intake.  She denies nausea or vomiting.  Pain is well controlled.  She has had flatus. She has had bowel movement.  Lochia Minimal.  Plan for birth control is  IUD.  Method of Feeding: breast  Physical Exam:  General: alert, cooperative, appears stated age and no distress Lochia: appropriate Uterine Fundus: firm DVT Evaluation: No evidence of  DVT seen on physical exam.  H/H: Lab Results  Component Value Date/Time   HGB 11.1* 09/03/2014 07:04 AM   HCT 32.9* 09/03/2014 07:04 AM    Discharge Diagnoses: Term Pregnancy-delivered  Discharge Information: Date: 09/05/2014 Activity: pelvic rest Diet: routine  Medications: PNV, Ibuprofen and metformin 1000 bid; insulin NPH 13 units bid at breakfast and bedtime Breast feeding:  Yes Condition: stable Instructions: refer to handout Discharge to: home  Evaluate need for continued insulin at postpartum visit.      Discharge Instructions    Discharge patient    Complete by:  As directed             Medication List    STOP taking these medications        insulin regular 100 units/mL injection  Commonly known as:  NOVOLIN R RELION      TAKE these medications        ibuprofen 600 MG tablet  Commonly known as:  ADVIL,MOTRIN  Take 1 tablet (600 mg total) by mouth every 6 (six) hours.     insulin NPH Human 100 UNIT/ML injection  Commonly known as:  HUMULIN N,NOVOLIN N  Inject 0.13 mLs (13 Units total) into the skin 2 (two) times daily at 8 am and 10 pm.     Insulin Syringe-Needle U-100 31G X 5/16" 0.3 ML Misc  1 Syringe by Does not apply route 3 (three) times daily before meals.       Follow-up Information    Follow up with [facility]. Schedule an appointment as soon as possible for a visit in 6 weeks.   Specialty:  Obstetrics and  Gynecology   Why:  for postpartum visit   Contact information:   [address] [place] [address] [phone]      [name] ,MD 09/05/2014,7:40 AM   CNM attestation I have seen and examined this patient and agree with above documentation in the resident's note.   [name] is a 29 y.o. [phone] s/p SVD (IOL for B-GDM).   Pain is well controlled.  Plan for birth control is IUD.  Method of Feeding: breast  PE:  BP 148/93 mmHg  Pulse 67  Temp(Src) 98 F (36.7 C) (Oral)  Resp 18  Ht   (1.473 m)  Wt 80.74 kg (178 lb)  BMI 37.21 kg/m2  SpO2 100%  LMP 12/03/2013  Breastfeeding? Unknown Fundus firm   Recent Labs  09/03/14 0704  HGB 11.1*  HCT 32.9*     Plan: discharge today - postpartum care discussed - f/u clinic in 6 weeks for postpartum visit   [name], CNM 10:34 AM  09/05/2014

## 2014-09-05 NOTE — Discharge Instructions (Signed)

## 2014-09-05 NOTE — Lactation Note (Signed)
This note was copied from the chart of Anita Bernise Sylvain. Lactation Consultation Note; Mom bottle feeding baby when I went into room with Eda to translate for me. Mom reports pain with latch which lasts through the whole feeding. Encouraged to page for assist at next feeding. Reviewed wide open mouth and getting the baby deep onto the breast- should feel tugging. No questions at present.   Patient Name: [name] [id number] Date: 09/05/2014 Reason for consult: Follow-up assessment   Maternal Data Formula Feeding for Exclusion: No Does the patient have breastfeeding experience prior to this delivery?: No  Feeding   LATCH Score/Interventions                      Lactation Tools Discussed/Used     Consult Status Consult Status: Follow-up Date: 09/05/14 Follow-up type: In-patient    Pamelia Hoit 09/05/2014, 8:25 AM

## 2014-09-09 ENCOUNTER — Encounter

## 2014-10-10 ENCOUNTER — Ambulatory Visit: Payer: Self-pay | Admitting: Obstetrics & Gynecology

## 2015-07-02 ENCOUNTER — Encounter (HOSPITAL_COMMUNITY): Payer: Self-pay | Admitting: *Deleted

## 2019-01-26 NOTE — L&D Delivery Note (Addendum)
Delivery Note Pt pushed x 1 contraction. At 1:53 PM a viable and healthy female was delivered via Vaginal, Spontaneous (Presentation: Left Occiput Anterior). Shoulders delivered easily. Moderate amount of blood clots passed after baby. Vigorous cry. Placed skin-to-skin w/ mom. NICU arrived immediately after delivery but was excused. APGAR: 9, 9; weight pending. TXA given prophylactically for Hx postpartum hemorrhage. Pitocin infusing 5 minutes after delivery.    Placenta status: Spontaneous, Intact. Dark, adherent clot noted.  cord: 3 vessels with the following complications: Short.  Cord pH: NA  Anesthesia: Epidural Episiotomy: None Lacerations: Superficial, hemostatic. No repair needed. Suture Repair: NA Est. Blood Loss (mL): 600 mL  Mom to postpartum.  Baby to Couplet care / Skin to Skin. Placenta to: Labor and delivery Feeding: Breast and bottle Circ: N/A Contraception: Undecided  [place] 01/23/2020, 2:33 PM

## 2019-07-03 ENCOUNTER — Ambulatory Visit: Admit: 2019-07-03 | Discharge: 2019-07-03 | Attending: Internal Medicine

## 2019-07-03 DIAGNOSIS — Z1159 Encounter for screening for other viral diseases: Secondary | ICD-10-CM

## 2019-07-03 LAB — POCT URINE PREGNANCY
Control: POSITIVE
Preg Test, Ur: POSITIVE

## 2019-07-06 ENCOUNTER — Encounter: Admit: 2019-07-06 | Discharge: 2019-07-06

## 2019-07-06 ENCOUNTER — Encounter

## 2019-07-06 NOTE — Telephone Encounter (Signed)
Pt states [facility] will not see her without insurance. She is requesting a new referral to Obstetrics at [facility]

## 2019-07-07 LAB — CBC WITH AUTO DIFFERENTIAL
Basophils %: 0.2 %
Basophils Absolute: 0 10*3/uL (ref 0.0–0.2)
Eosinophils %: 1.5 %
Eosinophils Absolute: 0.1 10*3/uL (ref 0.0–0.6)
Hematocrit: 34.7 % — ABNORMAL LOW (ref 36.0–48.0)
Hemoglobin: 11.3 g/dL — ABNORMAL LOW (ref 12.0–16.0)
Lymphocytes %: 25.3 %
Lymphocytes Absolute: 2.4 10*3/uL (ref 1.0–5.1)
MCH: 24.7 pg — ABNORMAL LOW (ref 26.0–34.0)
MCHC: 32.5 g/dL (ref 31.0–36.0)
MCV: 76.2 fL — ABNORMAL LOW (ref 80.0–100.0)
MPV: 8.1 fL (ref 5.0–10.5)
Monocytes %: 4.2 %
Monocytes Absolute: 0.4 10*3/uL (ref 0.0–1.3)
Neutrophils %: 68.8 %
Neutrophils Absolute: 6.4 10*3/uL (ref 1.7–7.7)
Platelets: 377 10*3/uL (ref 135–450)
RBC: 4.55 M/uL (ref 4.00–5.20)
RDW: 18.2 % — ABNORMAL HIGH (ref 12.4–15.4)
WBC: 9.3 10*3/uL (ref 4.0–11.0)

## 2019-07-07 LAB — LIPID, FASTING
Cholesterol, Fasting: 165 mg/dL (ref 0–199)
HDL: 74 mg/dL — ABNORMAL HIGH (ref 40–60)
LDL Calculated: 72 mg/dL (ref <100)
Triglyceride, Fasting: 97 mg/dL (ref 0–150)
VLDL Cholesterol Calculated: 19 mg/dL

## 2019-07-07 LAB — COMPREHENSIVE METABOLIC PANEL, FASTING
ALT: 13 U/L (ref 10–40)
AST: 11 U/L — ABNORMAL LOW (ref 15–37)
Albumin/Globulin Ratio: 1.6 (ref 1.1–2.2)
Albumin: 4.1 g/dL (ref 3.4–5.0)
Alkaline Phosphatase: 78 U/L (ref 40–129)
Anion Gap: 15 (ref 3–16)
BUN: 8 mg/dL (ref 7–20)
CO2: 21 mmol/L (ref 21–32)
Calcium: 8.9 mg/dL (ref 8.3–10.6)
Chloride: 104 mmol/L (ref 99–110)
Creatinine: <0.5 mg/dL — ABNORMAL LOW (ref 0.6–1.1)
GFR African American: >60 (ref >60)
GFR Non-African American: >60 (ref >60)
Globulin: 2.6 g/dL
Glucose, Fasting: 117 mg/dL — ABNORMAL HIGH (ref 70–99)
Potassium: 4.2 mmol/L (ref 3.5–5.1)
Sodium: 140 mmol/L (ref 136–145)
Total Bilirubin: 0.3 mg/dL (ref 0.0–1.0)
Total Protein: 6.7 g/dL (ref 6.4–8.2)

## 2019-07-07 LAB — HEMOGLOBIN A1C
Hemoglobin A1C: 8.2 %
eAG: 188.6 mg/dL

## 2019-07-07 LAB — HIV SCREEN
HIV ANTIGEN: NONREACTIVE
HIV Ag/Ab: NONREACTIVE
HIV-1 Antibody: NONREACTIVE
HIV-2 Ab: NONREACTIVE

## 2019-07-07 LAB — HCG, QUANTITATIVE, PREGNANCY: hCG Quant: 72214 m[IU]/mL (ref <5.0)

## 2019-07-07 LAB — HEPATITIS C ANTIBODY: Hep C Ab Interp: NONREACTIVE

## 2019-07-12 ENCOUNTER — Encounter

## 2019-07-12 NOTE — Telephone Encounter (Signed)
I put in [facility] [place] since none listed under [facility]    Lubertha Sayres, MD

## 2019-09-03 NOTE — Telephone Encounter (Signed)
Please advise if the pt has been notified

## 2019-10-31 ENCOUNTER — Ambulatory Visit: Admit: 2019-10-31 | Discharge: 2019-10-31 | Attending: Obstetrics & Gynecology

## 2019-10-31 DIAGNOSIS — Z3482 Encounter for supervision of other normal pregnancy, second trimester: Secondary | ICD-10-CM

## 2019-10-31 NOTE — Progress Notes (Signed)
Level II USD,preexisting type 2 DM during pregnancy.Reports + fetal movement, denies bleeding, leaking of fluid or ctx.Spanish interpreter present.

## 2019-10-31 NOTE — Progress Notes (Signed)
Maternal-Fetal Medicine    Please see the full documentation embedded in the ultrasound report (in Media Tab)  for complete detail.     Short synopsis of the findings & recommendations:     Normal anatomic survey, normal growth, placental location, and amniotic fluid.   Follow-up as clinically indicated    Electronically signed by Mallie Darting, MD on 10/31/2019 at 3:15 PM

## 2019-11-01 ENCOUNTER — Encounter

## 2020-01-22 ENCOUNTER — Encounter (HOSPITAL_COMMUNITY): Payer: Self-pay | Admitting: Obstetrics & Gynecology

## 2020-01-22 ENCOUNTER — Inpatient Hospital Stay (HOSPITAL_COMMUNITY)
Admission: AD | Admit: 2020-01-22 | Discharge: 2020-01-25 | DRG: 805 | Disposition: A | Discharge status: Home / Self Care | Payer: Medicaid Other | Attending: Obstetrics and Gynecology | Admitting: Obstetrics and Gynecology

## 2020-01-22 ENCOUNTER — Ambulatory Visit (INDEPENDENT_AMBULATORY_CARE_PROVIDER_SITE_OTHER): Payer: Self-pay | Admitting: Family Medicine

## 2020-01-22 ENCOUNTER — Other Ambulatory Visit: Payer: Self-pay

## 2020-01-22 ENCOUNTER — Encounter: Payer: Self-pay | Admitting: Family Medicine

## 2020-01-22 ENCOUNTER — Ambulatory Visit: Payer: Self-pay | Admitting: Registered"

## 2020-01-22 ENCOUNTER — Encounter: Payer: Medicaid Other | Attending: Family Medicine | Admitting: Registered"

## 2020-01-22 VITALS — BP 157/94 | HR 72 | Wt 203.3 lb

## 2020-01-22 DIAGNOSIS — O24419 Gestational diabetes mellitus in pregnancy, unspecified control: Secondary | ICD-10-CM | POA: Insufficient documentation

## 2020-01-22 DIAGNOSIS — O133 Gestational [pregnancy-induced] hypertension without significant proteinuria, third trimester: Secondary | ICD-10-CM

## 2020-01-22 DIAGNOSIS — O3663X0 Maternal care for excessive fetal growth, third trimester, not applicable or unspecified: Secondary | ICD-10-CM | POA: Diagnosis present

## 2020-01-22 DIAGNOSIS — O4693 Antepartum hemorrhage, unspecified, third trimester: Secondary | ICD-10-CM

## 2020-01-22 DIAGNOSIS — O0990 Supervision of high risk pregnancy, unspecified, unspecified trimester: Secondary | ICD-10-CM

## 2020-01-22 DIAGNOSIS — E119 Type 2 diabetes mellitus without complications: Secondary | ICD-10-CM

## 2020-01-22 DIAGNOSIS — O09523 Supervision of elderly multigravida, third trimester: Secondary | ICD-10-CM

## 2020-01-22 DIAGNOSIS — O134 Gestational [pregnancy-induced] hypertension without significant proteinuria, complicating childbirth: Secondary | ICD-10-CM | POA: Diagnosis present

## 2020-01-22 DIAGNOSIS — Z23 Encounter for immunization: Secondary | ICD-10-CM

## 2020-01-22 DIAGNOSIS — O24113 Pre-existing diabetes mellitus, type 2, in pregnancy, third trimester: Secondary | ICD-10-CM

## 2020-01-22 DIAGNOSIS — Z794 Long term (current) use of insulin: Secondary | ICD-10-CM

## 2020-01-22 DIAGNOSIS — Z3689 Encounter for other specified antenatal screening: Secondary | ICD-10-CM

## 2020-01-22 DIAGNOSIS — E1165 Type 2 diabetes mellitus with hyperglycemia: Secondary | ICD-10-CM | POA: Diagnosis present

## 2020-01-22 DIAGNOSIS — R58 Hemorrhage, not elsewhere classified: Secondary | ICD-10-CM

## 2020-01-22 DIAGNOSIS — Z3A37 37 weeks gestation of pregnancy: Secondary | ICD-10-CM

## 2020-01-22 DIAGNOSIS — O139 Gestational [pregnancy-induced] hypertension without significant proteinuria, unspecified trimester: Secondary | ICD-10-CM | POA: Diagnosis present

## 2020-01-22 DIAGNOSIS — Z789 Other specified health status: Secondary | ICD-10-CM | POA: Diagnosis present

## 2020-01-22 DIAGNOSIS — O2412 Pre-existing diabetes mellitus, type 2, in childbirth: Secondary | ICD-10-CM | POA: Diagnosis present

## 2020-01-22 DIAGNOSIS — Z8759 Personal history of other complications of pregnancy, childbirth and the puerperium: Secondary | ICD-10-CM

## 2020-01-22 DIAGNOSIS — O0933 Supervision of pregnancy with insufficient antenatal care, third trimester: Secondary | ICD-10-CM

## 2020-01-22 DIAGNOSIS — O3660X0 Maternal care for excessive fetal growth, unspecified trimester, not applicable or unspecified: Secondary | ICD-10-CM

## 2020-01-22 DIAGNOSIS — O24119 Pre-existing diabetes mellitus, type 2, in pregnancy, unspecified trimester: Secondary | ICD-10-CM

## 2020-01-22 DIAGNOSIS — Z20822 Contact with and (suspected) exposure to covid-19: Secondary | ICD-10-CM | POA: Diagnosis present

## 2020-01-22 DIAGNOSIS — O99214 Obesity complicating childbirth: Secondary | ICD-10-CM | POA: Diagnosis present

## 2020-01-22 DIAGNOSIS — IMO0002 Reserved for concepts with insufficient information to code with codable children: Secondary | ICD-10-CM

## 2020-01-22 DIAGNOSIS — O4593 Premature separation of placenta, unspecified, third trimester: Secondary | ICD-10-CM | POA: Diagnosis present

## 2020-01-22 LAB — TYPE AND SCREEN
ABO/RH(D): O POS
Antibody Screen: NEGATIVE

## 2020-01-22 LAB — CBC
HCT: 35.4 % — ABNORMAL LOW (ref 36.0–46.0)
Hemoglobin: 11.6 g/dL — ABNORMAL LOW (ref 12.0–15.0)
MCH: 26.6 pg (ref 26.0–34.0)
MCHC: 32.8 g/dL (ref 30.0–36.0)
MCV: 81.2 fL (ref 80.0–100.0)
Platelets: 251 10*3/uL (ref 150–400)
RBC: 4.36 MIL/uL (ref 3.87–5.11)
RDW: 15 % (ref 11.5–15.5)
WBC: 8.9 10*3/uL (ref 4.0–10.5)
nRBC: 0 % (ref 0.0–0.2)

## 2020-01-22 LAB — POCT URINALYSIS DIP (DEVICE)
Bilirubin Urine: NEGATIVE
Glucose, UA: 250 mg/dL — AB
Hgb urine dipstick: NEGATIVE
Leukocytes,Ua: NEGATIVE
Nitrite: NEGATIVE
Protein, ur: 30 mg/dL — AB
Specific Gravity, Urine: >=1.03 (ref 1.005–1.030)
Urobilinogen, UA: 0.2 mg/dL (ref 0.0–1.0)
pH: 6 (ref 5.0–8.0)

## 2020-01-22 LAB — COMPREHENSIVE METABOLIC PANEL
ALT: 21 U/L (ref 0–44)
AST: 24 U/L (ref 15–41)
Albumin: 2.1 g/dL — ABNORMAL LOW (ref 3.5–5.0)
Alkaline Phosphatase: 189 U/L — ABNORMAL HIGH (ref 38–126)
Anion gap: 9 (ref 5–15)
BUN: 14 mg/dL (ref 6–20)
CO2: 21 mmol/L — ABNORMAL LOW (ref 22–32)
Calcium: 8.4 mg/dL — ABNORMAL LOW (ref 8.9–10.3)
Chloride: 106 mmol/L (ref 98–111)
Creatinine, Ser: 0.75 mg/dL (ref 0.44–1.00)
GFR, Estimated: >60 mL/min (ref >60)
Glucose, Bld: 85 mg/dL (ref 70–99)
Potassium: 4 mmol/L (ref 3.5–5.1)
Sodium: 136 mmol/L (ref 135–145)
Total Bilirubin: 0.1 mg/dL — ABNORMAL LOW (ref 0.3–1.2)
Total Protein: 5.1 g/dL — ABNORMAL LOW (ref 6.5–8.1)

## 2020-01-22 LAB — GLUCOSE, CAPILLARY
Glucose-Capillary: 110 mg/dL — ABNORMAL HIGH (ref 70–99)
Glucose-Capillary: 59 mg/dL — ABNORMAL LOW (ref 70–99)

## 2020-01-22 LAB — PROTEIN / CREATININE RATIO, URINE
Creatinine, Urine: 40.27 mg/dL
Protein Creatinine Ratio: 0.25 mg/mg{Cre} — ABNORMAL HIGH (ref 0.00–0.15)
Total Protein, Urine: 10 mg/dL

## 2020-01-22 LAB — HIV ANTIBODY (ROUTINE TESTING W REFLEX): HIV Screen 4th Generation wRfx: NONREACTIVE

## 2020-01-22 LAB — HEPATITIS B SURFACE ANTIGEN: Hepatitis B Surface Ag: NONREACTIVE

## 2020-01-22 LAB — GROUP B STREP BY PCR: Group B strep by PCR: NEGATIVE

## 2020-01-22 MED ORDER — FENTANYL CITRATE (PF) 100 MCG/2ML IJ SOLN
INTRAMUSCULAR | Status: AC
  Administered 2020-01-22: 100 ug via INTRAVENOUS
  Filled 2020-01-22: qty 2

## 2020-01-22 MED ORDER — DIPHENHYDRAMINE HCL 50 MG/ML IJ SOLN: 12.5000 mg | INTRAMUSCULAR | Status: DC | PRN

## 2020-01-22 MED ORDER — EPHEDRINE 5 MG/ML INJ: 10.0000 mg | INTRAVENOUS | Status: DC | PRN

## 2020-01-22 MED ORDER — OXYCODONE-ACETAMINOPHEN 5-325 MG PO TABS: 1.0000 | ORAL_TABLET | ORAL | Status: DC | PRN

## 2020-01-22 MED ORDER — MISOPROSTOL 50MCG HALF TABLET: 50.0000 ug | ORAL_TABLET | Freq: Once | ORAL | Status: AC

## 2020-01-22 MED ORDER — LACTATED RINGERS IV SOLN: 500.0000 mL | INTRAVENOUS | Status: DC | PRN

## 2020-01-22 MED ORDER — OXYTOCIN BOLUS FROM INFUSION
333.0000 mL | Freq: Once | INTRAVENOUS | Status: AC
  Administered 2020-01-23: 14:00:00 333 mL via INTRAVENOUS

## 2020-01-22 MED ORDER — MISOPROSTOL 50MCG HALF TABLET
ORAL_TABLET | ORAL | Status: AC
  Administered 2020-01-22: 50 ug via ORAL
  Filled 2020-01-22: qty 1

## 2020-01-22 MED ORDER — OXYCODONE-ACETAMINOPHEN 5-325 MG PO TABS: 2.0000 | ORAL_TABLET | ORAL | Status: DC | PRN

## 2020-01-22 MED ORDER — PHENYLEPHRINE 40 MCG/ML (10ML) SYRINGE FOR IV PUSH (FOR BLOOD PRESSURE SUPPORT): 80.0000 ug | PREFILLED_SYRINGE | INTRAVENOUS | Status: DC | PRN

## 2020-01-22 MED ORDER — LACTATED RINGERS IV SOLN: 500.0000 mL | Freq: Once | INTRAVENOUS | Status: DC

## 2020-01-22 MED ORDER — FLEET ENEMA 7-19 GM/118ML RE ENEM: 1.0000 | ENEMA | Freq: Once | RECTAL | Status: DC

## 2020-01-22 MED ORDER — FENTANYL CITRATE (PF) 100 MCG/2ML IJ SOLN: 100.0000 ug | INTRAMUSCULAR | Status: DC | PRN

## 2020-01-22 MED ORDER — ACETAMINOPHEN 325 MG PO TABS: 650.0000 mg | ORAL_TABLET | ORAL | Status: DC | PRN

## 2020-01-22 MED ORDER — SOD CITRATE-CITRIC ACID 500-334 MG/5ML PO SOLN: 30.0000 mL | ORAL | Status: DC | PRN

## 2020-01-22 MED ORDER — MISOPROSTOL 50MCG HALF TABLET
50.0000 ug | ORAL_TABLET | Freq: Once | ORAL | Status: AC
  Administered 2020-01-22: 20:00:00 50 ug via ORAL
  Filled 2020-01-22: qty 1

## 2020-01-22 MED ORDER — LIDOCAINE HCL (PF) 1 % IJ SOLN: 30.0000 mL | INTRAMUSCULAR | Status: DC | PRN

## 2020-01-22 MED ORDER — FENTANYL-BUPIVACAINE-NACL 0.5-0.125-0.9 MG/250ML-% EP SOLN
EPIDURAL | Status: AC
  Filled 2020-01-22: qty 250

## 2020-01-22 MED ORDER — LACTATED RINGERS IV SOLN: INTRAVENOUS | Status: DC

## 2020-01-22 MED ORDER — ONDANSETRON HCL 4 MG/2ML IJ SOLN: 4.0000 mg | Freq: Four times a day (QID) | INTRAMUSCULAR | Status: DC | PRN

## 2020-01-22 MED ORDER — FENTANYL-BUPIVACAINE-NACL 0.5-0.125-0.9 MG/250ML-% EP SOLN: 12.0000 mL/h | EPIDURAL | Status: DC | PRN

## 2020-01-22 MED ORDER — OXYTOCIN-SODIUM CHLORIDE 30-0.9 UT/500ML-% IV SOLN: 2.5000 [IU]/h | INTRAVENOUS | Status: DC

## 2020-01-22 NOTE — Progress Notes (Signed)
Labor Progress Note [name] is a 34 y.o. [phone] at [redacted]w[redacted]d presented for IOL-uncontrolled T2DM.  S: Doing well without complaints, starting to feel contractions.   O:  BP 133/86    Pulse (!) 58    Temp 98.7 F (37.1 C) (Oral)    Resp 16    Ht 4\' 10"  (1.473 m)    Wt 92.2 kg    LMP 04/28/2019    BMI 42.49 kg/m  EFM: 130bpm/mod variability/+ accels Toco: q1-5 min  CVE: Dilation: 3 Effacement (%): Thick Station: -3 Presentation: Vertex Exam by:: Dr [email or web address]   A&P: 34 y.o. 29 [redacted]w[redacted]d presented for IOL-uncontrolled T2DM. #IOL: S/p cytotec x1. Given cervical exam will re-dose cytotec and likely start pitocin in 4 hours with next check. #Pain: PRN, desires epidural #FWB: cat 1 #GBS negative by PCR on arrival  #T2DM: uncontrolled, has not checked BGL the past couple of days. On metformin and insulin at home. Given BGL, have not started meds since admission. BGL q4 hours and initiate insulin if necessary.  #gHTN: elevated BP in clinic today and on admit, meeting criteria for gHTN. Asymptomatic. PreE labs normal.  #History of PPH: txa at delivery  [redacted]w[redacted]d, MD 11:45 PM

## 2020-01-22 NOTE — Patient Instructions (Signed)
AREA PEDIATRIC/FAMILY PRACTICE PHYSICIANS  [place] ([address]) . [facility] o Chambliss, MD; Eniola, MD; Hale, MD; Hensel, MD; McDiarmid, MD; McIntyer, MD; Neal, MD; Walden, MD o [address] [place], [place] [address] o [phone] o Mon-Fri 8:30-12:30, 1:30-5:00 o Providers come to see babies at [facility] o Accepting Medicaid . [facility] o Limited providers who accept newborns: Koirala, MD; Morrow, MD; Wolters, MD o [address], [place], [place] [address] o [phone] o Mon-Fri 8:00-5:30 o Babies seen by providers at [facility] o Does NOT accept Medicaid o Please call early in hospitalization for appointment (limited availability)  . [facility] o Mulberry, MD o [address] [place], [place] [address] o [phone] o Mon, Tue, Thur, Fri 8:30-5:00, Wed 10:00-7:00 (closed 1-2pm) o Babies seen by [facility] providers o Accepting Medicaid . [facility] - Pediatrician o Rubin, MD o [address], [place], [place] [address] o [phone] o Mon-Fri 8:30-5:00, Sat 8:30-12:00 o Provider comes to see babies at [facility] o Accepting Medicaid o Must have been referred from current patients or contacted office prior to delivery . [facility] ([facility]) o Brown, MD; Chandler, MD; Ettefagh, MD; Grant, MD; Lester, MD; McCormick, MD; McQueen, MD; Prose, MD; Simha, MD; Stanley, MD; Stryffeler, NP; Tebben, NP o [address], [place], [place] [address] o [phone] o Mon, Tue, Thur, Fri 8:30-5:30, Wed 9:30-5:30, Sat 8:30-12:30 o Babies seen by [facility] providers o Accepting Medicaid o Only accepting infants of first-time parents or siblings of current patients o Hospital discharge coordinator will make follow-up appointment . Jack Amos o [address],  [place], [place]  [address] o [phone]   Fax - [phone] . [facility] o [address], [place], [place]  [address] o Phone - [phone]   Fax - [phone] . Shilpa Gosrani o [address], [place], [place]  [address] o [phone]  [place] ([address]) .  [facility] o Bates, MD; Brassfield, MD; Cooper, Cox, MD; MD; Davis, MD; Dovico, MD; Ettefaugh, MD; Little, MD; Lowe, MD; Keiffer, MD; Melvin, MD; Sumner, MD; Williams, MD o [address], [place], [place] [address] o [phone] o Mon-Fri 8:30-5:00 (extended evenings Mon-Thur as needed), Sat-Sun 10:00-1:00 o Providers come to see babies at [facility] o Accepting Medicaid for families of first-time babies and families with all children in the household age 3 and under. Must register with office prior to making appointment (M-F only). . [facility] o Henson, NP; Knapp, MD; Lalonde, MD; Tysinger, PA o [address] [place], [place] [address] o [phone] o Mon-Fri 8:00-5:00 o Babies seen by providers at [facility] o Does NOT accept Medicaid/Commercial Insurance Only . [facility] ([facility])  o Artis, MD; Barnes, MD; Bratton, MD; Coccaro, MD; Lockett Gardner, MD; Kramer, MD; Marshall, MD; Netherton, MD; Poleto, MD; Skinner, MD o [address] [place], [place] [address] o [phone] o Mon-Fri 8:30-5:30, Sat (Oct.-Mar.) 9:00-1:00 o Babies seen by providers at [facility] o Accepting Medicaid  [place] ([address]) . [facility] o Reid, MD; Warner, MD o [address], ,  [address] o [phone] o Mon-Fri 8:30-5:00, Sat 8:30-12:00 o Providers come to see babies at [facility] o Does NOT accept Medicaid . [facility] o Becker, PA; Hagler, MD; Scifres, PA; Sun, MD; Swayne, MD o [address],  [place], [place] [address] o [phone] o Mon-Fri 8:00-5:00 o Babies seen by providers at [facility] o Does NOT accept Medicaid o Only accepting babies of parents who are patients o Please call early in hospitalization for appointment (limited availability) . [facility] o Clark, MD; Frye, MD; Kelleher, MD; Mack, NP; Miller, MD; O'Keller, MD; Patterson, NP; Pudlo, MD; Puzio, MD; Thomas, MD; Tucker, MD; Twiselton, MD o [address], [place], [place] [address] o [phone] o Mon-Fri 8:00-5:00, Sat 9:00-12:00 o Providers come to see babies at [facility] o Does NOT accept Medicaid  [place] ([address]) . [facility] o Limited providers accepting new patients: Brake, NP; Wharton, PA o [address], [place], [place] [address] o [phone] o Mon-Fri 8:00-5:00 o Babies seen by providers at [facility] o Does NOT accept Medicaid o Only accepting babies of parents who are patients o Please call early in hospitalization for appointment (limited availability) . [facility] o Gay, MD; Quinlan, MD o [address] [place], [place] [address] o [phone] (press [phone] to schedule appointment) o Mon-Fri 8:00-5:00 o Providers come to see babies at [facility] o Does NOT accept Medicaid . [facility] o Mazer, MD o [address] [place], [place] [address] o [phone] o Mon-Fri 8:30-5:00 (lunch 12:30-1:00), extended hours by appointment only Wed 5:00-6:30 o Babies seen by [facility] providers o Accepting Medicaid . [facility] o Banks, MD; Jordan, MD; Koberlein, MD o [address], [place], [place] [address] o [phone] o Mon-Fri 8:00-5:00 o Babies seen by [facility] providers o Does NOT accept Medicaid . [facility] o Parker, MD; Hunter, MD; Wallace, DO o [address] [place], [place]  [address] o [phone] o Mon-Fri 8:00-5:00 o Babies seen by [facility] providers o Does NOT accept Medicaid . [facility] o Brandon, PA; Brecken, PA; Christy, NP; Dees, MD; DeClaire, MD; DeWeese, MD; Hansen, NP; Mills, NP; Parrish, NP; Smoot, NP; Summer, MD; Vapne, MD o [address] [place], [place] [address] o [phone] o Mon-Fri 8:30-5:00, Sat 10:00-1:00 o Providers come to see babies at [facility] o Does NOT accept Medicaid o Free prenatal information session Tuesdays at 4:45pm . [facility] o Bouska, MD; Gordon, PA; Jeffery, PA; Weber, PA o [address] [place] [address] o [phone] o Mon-Fri 7:30-5:30 o Babies seen by [facility] providers . [facility] o [address], [place], [place]  [address] o [phone]   Fax - [phone]  [place] ([address] & [address]) . [facility] o Reese, MD o [address] [place], [place] [address] o [phone] o Mon-Thur 8:00-6:00 o Providers come to see babies at [facility] o Accepting Medicaid . [facility] o Anderson, NP; Badger, MD; Beal, PA; Spencer, PA o [address] [place], [place] [address] o [phone] o Mon-Thur 7:30-7:30, Fri 7:30-4:30 o Babies seen by [facility] providers o Accepting Medicaid . [facility] o Agbuya, MD; Klett, NP; Romgoolam, MD o [address], [place], [place] [address] o [phone] o Mon-Fri 8:30-5:00, Sat 8:30-12:00 o Providers come to see babies at [facility] o Accepting Medicaid o Must have "Meet & Greet" appointment at office prior to delivery . [facility] ([facility] o McCord,   MD; Wallace, MD; Wood, MD o [address], [place], [place] [address] o [phone] o Mon-Wed 8:00-6:00, Thur-Fri 8:00-5:00, Sat 9:00-12:00 o Providers come to  see babies at [facility] o Does NOT accept Medicaid o Only accepting siblings of current patients . [facility]  o [address], [place], [place]  [address] o [phone]   Fax - [phone] . [facility] o [address], [place], [place]  [address] o [phone]   Fax - [phone]  [place] ([address] & [address]) . [facility] o Cirigliano, DO; Matthews, DO o [address] [place], [place] [address] o [phone] o Mon-Fri 7:00-5:00 o Babies seen by [facility] providers o Does NOT accept Medicaid . [facility] o Briscoe, MD; Howley, PA; Moreira, PA o [address], [place], [place] [address] o [phone] o Mon-Fri 8:00-5:00 o Babies seen by [facility] providers o Accepting Medicaid . [facility] o Boyd, MD; Church, PA; Jones, NP; Osborn, PA o [address], [place], [place] [address] o [phone] o Mon-Fri 8:00-5:00 o Babies seen by providers at [facility] o Accepting Medicaid   

## 2020-01-22 NOTE — Progress Notes (Signed)
Subjective:   [name] is a 34 y.o. [phone] at [redacted]w[redacted]d by LMP being seen today for her first obstetrical visit.  Her obstetrical history is significant for advanced maternal age and Type 2 DM. Patient does intend to breast feed. Pregnancy history fully reviewed.  Patient reports no complaints.  Patient arrived recently from [place], had regular prenatal care and ultrasounds Was told stomach was large on prior US, otherwise normal Takes NPH 80/55, regular insulin not taking Reports morning fastings are 70-80's Post prandials 160-180 Doctor in [place] told her the baby was large  Denies headache, vision changes, RUQ pain, chest pain, SOB Does have LE edema (1+)   HISTORY: OB History  Gravida Para Term Preterm AB Living  4 3 3  0 0 3  SAB IAB Ectopic Multiple Live Births  0 0 0 0 3    # Outcome Date GA Lbr Len/2nd Weight Sex Delivery Anes PTL Lv  4 Current           3 Term 09/03/14 [redacted]w[redacted]d / 00:12 8 lb 14.5 oz (4.04 kg) F Vag-Spont EPI  LIV     Apgar1: 7  Apgar5: 9  2 Term 05/19/12 [redacted]w[redacted]d 08:00 / 00:12 8 lb 14.9 oz (4.051 kg) M Vag-Spont EPI  LIV     Name: [name]     Apgar1: 9  Apgar5: 9  1 Term  [redacted]w[redacted]d  8 lb (3.629 kg)     LIV   Last pap smear was  08/12/2014 and was normal Past Medical History:  Diagnosis Date  . Diabetes mellitus without complication (HCC)    states she was dx in 08/14/2014; no records avail  . Diabetes mellitus without complication (HCC)   . Gestational diabetes    Past Surgical History:  Procedure Laterality Date  . NO PAST SURGERIES     Family History  Problem Relation Age of Onset  . Hypertension Mother   . Depression Mother    Social History   Tobacco Use  . Smoking status: Never Smoker  Substance Use Topics  . Alcohol use: No  . Drug use: No   No Known Allergies Current Outpatient Medications on File Prior to Visit  Medication Sig Dispense Refill  . metFORMIN (GLUCOPHAGE) 500 MG tablet Take 1 tablet (500 mg total)  by mouth 2 (two) times daily with a meal. 60 tablet 2  . prenatal vitamin w/FE, FA (PRENATAL 1 + 1) 27-1 MG TABS tablet Take 1 tablet by mouth daily at 12 noon.     No current facility-administered medications on file prior to visit.     Exam   Vitals:   01/22/20 1557 01/22/20 1620  BP: (!) 139/92 (!) 157/94  Pulse: 85 72  Weight: 203 lb 4.8 oz (92.2 kg)       Uterus:     Pelvic Exam: Perineum: no hemorrhoids, normal perineum   Vulva: normal external genitalia, no lesions   Vagina:  normal mucosa, normal discharge   Cervix: no lesions and normal, pap smear done.    Adnexa: normal adnexa and no mass, fullness, tenderness   Bony Pelvis: average  System: General: well-developed, well-nourished female in no acute distress   Breast:  normal appearance, no masses or tenderness   Skin: normal coloration and turgor, no rashes   Neurologic: oriented, normal, negative, normal mood   Extremities: normal strength, tone, and muscle mass, ROM of all joints is normal   HEENT PERRLA, extraocular movement intact and sclera clear,  anicteric   Mouth/Teeth mucous membranes moist, pharynx normal without lesions and dental hygiene good   Neck supple and no masses   Cardiovascular: regular rate and rhythm   Respiratory:  no respiratory distress, normal breath sounds   Abdomen: soft, non-tender; bowel sounds normal; no masses,  no organomegaly     Assessment:   Pregnancy: [id number] Patient Active Problem List   Diagnosis Date Noted  . Supervision of high risk pregnancy, antepartum 01/22/2020  . Postpartum hemorrhage 09/03/2014  . Diabetes mellitus, type 2 (HCC) 05/08/2012  . Pre-existing type 2 diabetes mellitus during pregnancy, antepartum 05/08/2012     Plan:  1. Supervision of high risk pregnancy, antepartum Patient presenting with elevated BP's and some mild (1+) pitting edema c/f PreE In addition on review of sugar recall (does not have a log) sugars are not well controlled and there is  some concern for macrosomia (my leopolds at least 4000g, pelvis proven to 4000g) Given HTN and poorly controlled GDM I recommended patient proceed to L&D directly for delivery Will need routine prenatal labs/swabs as well as PreE workup on arrival, these were not drawn at office Would also consider growth [place] to see if EFW>5kg Patient would like BTL if possible, otherwise IUD at [facility] Signout given to Dr. Shawnie Little and Dr. Adrian Little  - [place] MFM OB DETAIL +14 WK; Future - CBC/D/Plt+RPR+Rh+ABO+Rub Ab... - Hemoglobin A1c - Culture, beta strep (group b only) - Cervicovaginal ancillary only( [name]) - Culture, OB Urine    Return in about 6 weeks (around 03/04/2020) for PP check.

## 2020-01-22 NOTE — H&P (Addendum)
LABOR AND DELIVERY ADMISSION HISTORY AND PHYSICAL NOTE  [name] is a 34 y.o. female [phone] with IUP at [redacted]w[redacted]d by LMP presenting for IOL for uncontrolled T2DM and concern for elevated BPs.  She reports positive fetal movement. She denies leakage of fluid or vaginal bleeding. She denies headaches, vision changes, cp, sob. She plans on breast and bottle feeding. She is undecided about birth control.  Prenatal History/Complications: PNC at [facility], 1 visit day of admit  Pregnancy complications:  - limited and late [facility], established day of admission>>reports PNC in [place] that was unremarkable  -uncontrolled T2DM, intermittent BGL monitoring (on metformin and insulin) -gHTN on admit (criteria by elevated BP in clinic and on admit) -concern for macrosomia, pelvis proven to 4000g  -AMA  Past Medical History: Past Medical History:  Diagnosis Date  . Diabetes mellitus without complication (HCC)    states she was dx in [place]; no records avail  . Diabetes mellitus without complication (HCC)   . Gestational diabetes     Past Surgical History: Past Surgical History:  Procedure Laterality Date  . NO PAST SURGERIES      Obstetrical History: OB History    Gravida  4   Para  3   Term  3   Preterm  0   AB  0   Living  3     SAB  0   IAB  0   Ectopic  0   Multiple      Live Births  3           Social History: Social History   Socioeconomic History  . Marital status: Married    Spouse name: Not on file  . Number of children: Not on file  . Years of education: Not on file  . Highest education level: Not on file  Occupational History  . Not on file  Tobacco Use  . Smoking status: Never Smoker  . Smokeless tobacco: Not on file  Substance and Sexual Activity  . Alcohol use: No  . Drug use: No  . Sexual activity: Yes    Birth control/protection: None  Other Topics Concern  . Not on file  Social History Narrative   ** Merged History Encounter **        Social Determinants of Health   Financial Resource Strain: Not on file  Food Insecurity: No Food Insecurity  . Worried About Programme researcher, broadcasting/film/video in the Last Year: Never true  . Ran Out of Food in the Last Year: Never true  Transportation Needs: No Transportation Needs  . Lack of Transportation (Medical): No  . Lack of Transportation (Non-Medical): No  Physical Activity: Not on file  Stress: Not on file  Social Connections: Not on file    Family History: Family History  Problem Relation Age of Onset  . Hypertension Mother   . Depression Mother     Allergies: No Known Allergies  Medications Prior to Admission  Medication Sig Dispense Refill Last Dose  . insulin aspart (NOVOLOG) 100 UNIT/ML injection Inject into the skin in the morning and at bedtime.   01/22/2020 at Unknown time  . metFORMIN (GLUCOPHAGE) 500 MG tablet Take 1 tablet (500 mg total) by mouth 2 (two) times daily with a meal. 60 tablet 2 01/22/2020 at Unknown time  . prenatal vitamin w/FE, FA (PRENATAL 1 + 1) 27-1 MG TABS tablet Take 1 tablet by mouth daily at 12 noon.   01/22/2020 at Unknown time  Review of Systems  All systems reviewed and negative except as stated in HPI  Physical Exam Blood pressure 123/88, pulse 74, temperature 98.7 F (37.1 C), temperature source Oral, resp. rate 17, height 4\' 10"  (1.473 m), weight 92.2 kg, last menstrual period 04/28/2019, unknown if currently breastfeeding. General appearance: alert, oriented, NAD Lungs: normal respiratory effort Heart: regular rate Abdomen: soft, non-tender; gravid, FH appropriate for GA Extremities: No calf swelling or tenderness Presentation: cephalic Fetal monitoring: 150 bpm, moderate variability, +acels, no decels  Uterine activity: Irregular  Dilation: 1 Effacement (%): Thick Station: Ballotable Exam by:: Dr [email or web address]  Prenatal labs: ABO, Rh: --/--/O POS (12/28 1854) Antibody: NEG (12/28 1854) Rubella:  pending RPR:    pending HBsAg:   pending HIV:   pending GC/Chlamydia: pending GBS:  PCR pending 2-hr GTT: Not performed. Preexisting DM.  Genetic screening:  Not performed  Anatomy 03-10-1980: Not performed   Prenatal Transfer Tool  Maternal Diabetes: Yes:  Diabetes Type:  Pre-pregnancy, on metformin and insulin Genetic Screening: Declined --late to [facility]  Maternal Ultrasounds/Referrals: Declined Fetal Ultrasounds or other Referrals:  None Maternal Substance Abuse:  No Significant Maternal Medications:  Meds include: Other: Insulin  Significant Maternal Lab Results: pending  Results for orders placed or performed during the hospital encounter of 01/22/20 (from the past 24 hour(s))  CBC   Collection Time: 01/22/20  6:40 PM  Result Value Ref Range   WBC 8.9 4.0 - 10.5 K/uL   RBC 4.36 3.87 - 5.11 MIL/uL   Hemoglobin 11.6 (L) 12.0 - 15.0 g/dL   HCT 01/24/20 (L) 78.2 - 95.6 %   MCV 81.2 80.0 - 100.0 fL   MCH 26.6 26.0 - 34.0 pg   MCHC 32.8 30.0 - 36.0 g/dL   RDW 21.3 08.6 - 57.8 %   Platelets 251 150 - 400 K/uL   nRBC 0.0 0.0 - 0.2 %  Type and screen [facility]   Collection Time: 01/22/20  6:54 PM  Result Value Ref Range   ABO/RH(D) O POS    Antibody Screen NEG    Sample Expiration      01/25/2020,2359 Performed at [facility], [address], [place], [place]   Glucose, capillary   Collection Time: 01/22/20  7:27 PM  Result Value Ref Range   Glucose-Capillary 110 (H) 70 - 99 mg/dL  Results for orders placed or performed in visit on 01/22/20 (from the past 24 hour(s))  POCT urinalysis dip (device)   Collection Time: 01/22/20  3:47 PM  Result Value Ref Range   Glucose, UA 250 (A) NEGATIVE mg/dL   Bilirubin Urine NEGATIVE NEGATIVE   Ketones, ur TRACE (A) NEGATIVE mg/dL   Specific Gravity, Urine >=1.030 1.005 - 1.030   Hgb urine dipstick NEGATIVE NEGATIVE   pH 6.0 5.0 - 8.0   Protein, ur 30 (A) NEGATIVE mg/dL   Urobilinogen, UA 0.2 0.0 - 1.0 mg/dL   Nitrite  NEGATIVE NEGATIVE   Leukocytes,Ua NEGATIVE NEGATIVE    Patient Active Problem List   Diagnosis Date Noted  . Supervision of high risk pregnancy, antepartum 01/22/2020  . Indication for care in labor or delivery 01/22/2020  . Gestational hypertension 01/22/2020  . Language barrier 01/22/2020  . History of postpartum hemorrhage 09/03/2014  . Diabetes mellitus, type 2 (HCC) 05/08/2012  . Pre-existing type 2 diabetes mellitus during pregnancy, antepartum 05/08/2012    Assessment: [name] is a 34 y.o. [phone] at [redacted]w[redacted]d here for IOL for uncontrolled T2DM and  now with gHTN.  #IOL: Discussed IOL process with patient, given cervical exam cytotec x1. Discussed risks/benefits of FB placement. Unable to place at this time, will attempt with next cervical exam. #No PNC: prenatal labs pending, SW postpartum #gHTN: elevated BP in clinic today and on admit, meeting criteria for gHTN. Asymptomatic. PreE labs pending. #DM2: BGL uncontrolled at home. Taking metformin and insulin at home. q4 BGL in latent labor and q2 in active labor, will decide hospital regimen pending BGL. #History of PPH: txa at delivery #Pain: PRN #FWB: Cat I  #ID:  GBS unknown. Prophylaxis not warranted given term and no significant risk factors. PCR pending. GC/Chlamydia ordered.  #MOF: both #MOC:BTL vs. IUD, uninsured, counseled on options #Circ: n/a   Alric Seton 01/22/2020, 9:41 PM

## 2020-01-22 NOTE — Progress Notes (Signed)
Interpreter services provided by [employer] [phone] and in-person interpreter Raquel from [employer] video  Patient was seen on 01/22/20 for Type 2 Diabetes in Pregnancy.  Patient reports she was first diagnosed about 9 years ago and has had diabetes education in [place]. Pt states knowledge of foods that contain carbohydrates. Patient states she was given a week supply of insulin and has just ran out.  Pt states EDD 02/11/20.   Medication (Pt reported): Metformin 500 bid  Novolin 80 units am; 55 units pm (x1 week)   FBS: 70-89 mg/dL; patient states no readings above 95 mg/dL Post meals: 413- >244 mg/dL  Pt reports frequent hypoglycemic symptoms at 50-64 mg/dL, sometimes due to skipping meals, but may also happen after meals.  Pt reports drinking 15 grams juice/soda to treat low blood sugar.  Pt states her blood sugar readings are improved when she limits her intake to 1-2 carbohydrate servings in a meal, but are still 160 mg/dL. Pt states when she eats out she tends to eat closer to 4-5 carb servings and readings are close to 200 mg/dL  Patient had purchased glucometer from [employer] Blood glucose monitor given: [employer] Lot # [id number] CBG: 187 mg/dL (90 min post meal)  Patient instructed to monitor glucose levels: FBS: 60 - 95 mg/dl 2 hour: <644 mg/dl  Patient received the following handouts:  Nutrition Diabetes and Pregnancy  Carbohydrate Counting List  Blood glucose Log Sheet  There was only enough time for assessment today. Patient will be seen for follow-up ASAP for education.

## 2020-01-23 ENCOUNTER — Ambulatory Visit: Payer: Self-pay

## 2020-01-23 ENCOUNTER — Inpatient Hospital Stay (HOSPITAL_COMMUNITY): Payer: Medicaid Other | Admitting: Anesthesiology

## 2020-01-23 ENCOUNTER — Encounter (HOSPITAL_COMMUNITY): Payer: Self-pay | Admitting: Obstetrics & Gynecology

## 2020-01-23 ENCOUNTER — Inpatient Hospital Stay (HOSPITAL_BASED_OUTPATIENT_CLINIC_OR_DEPARTMENT_OTHER): Payer: Medicaid Other

## 2020-01-23 ENCOUNTER — Other Ambulatory Visit: Payer: Self-pay

## 2020-01-23 DIAGNOSIS — Z794 Long term (current) use of insulin: Secondary | ICD-10-CM

## 2020-01-23 DIAGNOSIS — O4693 Antepartum hemorrhage, unspecified, third trimester: Secondary | ICD-10-CM

## 2020-01-23 DIAGNOSIS — Z3A37 37 weeks gestation of pregnancy: Secondary | ICD-10-CM

## 2020-01-23 DIAGNOSIS — O09293 Supervision of pregnancy with other poor reproductive or obstetric history, third trimester: Secondary | ICD-10-CM

## 2020-01-23 DIAGNOSIS — O24113 Pre-existing diabetes mellitus, type 2, in pregnancy, third trimester: Secondary | ICD-10-CM

## 2020-01-23 DIAGNOSIS — O09523 Supervision of elderly multigravida, third trimester: Secondary | ICD-10-CM

## 2020-01-23 DIAGNOSIS — O0933 Supervision of pregnancy with insufficient antenatal care, third trimester: Secondary | ICD-10-CM

## 2020-01-23 DIAGNOSIS — O133 Gestational [pregnancy-induced] hypertension without significant proteinuria, third trimester: Secondary | ICD-10-CM

## 2020-01-23 DIAGNOSIS — E119 Type 2 diabetes mellitus without complications: Secondary | ICD-10-CM

## 2020-01-23 DIAGNOSIS — O2412 Pre-existing diabetes mellitus, type 2, in childbirth: Secondary | ICD-10-CM

## 2020-01-23 DIAGNOSIS — O368130 Decreased fetal movements, third trimester, not applicable or unspecified: Secondary | ICD-10-CM

## 2020-01-23 LAB — HEMOGLOBIN A1C
Hgb A1c MFr Bld: 6.5 % — ABNORMAL HIGH (ref 4.8–5.6)
Mean Plasma Glucose: 139.85 mg/dL

## 2020-01-23 LAB — GLUCOSE, CAPILLARY
Glucose-Capillary: 63 mg/dL — ABNORMAL LOW (ref 70–99)
Glucose-Capillary: 74 mg/dL (ref 70–99)
Glucose-Capillary: 267 mg/dL — ABNORMAL HIGH (ref 70–99)

## 2020-01-23 LAB — RPR: RPR Ser Ql: NONREACTIVE

## 2020-01-23 LAB — GC/CHLAMYDIA PROBE AMP (~~LOC~~) NOT AT ARMC
Chlamydia: NEGATIVE
Comment: NEGATIVE
Comment: NORMAL
Neisseria Gonorrhea: NEGATIVE

## 2020-01-23 LAB — SARS CORONAVIRUS 2 (TAT 6-24 HRS): SARS Coronavirus 2: NEGATIVE

## 2020-01-23 MED ORDER — OXYCODONE-ACETAMINOPHEN 5-325 MG PO TABS: 2.0000 | ORAL_TABLET | ORAL | Status: DC | PRN

## 2020-01-23 MED ORDER — DIBUCAINE (PERIANAL) 1 % EX OINT: 1.0000 "application " | TOPICAL_OINTMENT | CUTANEOUS | Status: DC | PRN

## 2020-01-23 MED ORDER — FERROUS SULFATE 325 (65 FE) MG PO TABS
325.0000 mg | ORAL_TABLET | Freq: Two times a day (BID) | ORAL | Status: DC
  Administered 2020-01-23 – 2020-01-25 (×4): 325 mg via ORAL
  Filled 2020-01-23 (×4): qty 1

## 2020-01-23 MED ORDER — METFORMIN HCL 500 MG PO TABS
1000.0000 mg | ORAL_TABLET | Freq: Two times a day (BID) | ORAL | Status: DC
  Administered 2020-01-23 – 2020-01-25 (×4): 1000 mg via ORAL
  Filled 2020-01-23 (×4): qty 2

## 2020-01-23 MED ORDER — OXYCODONE-ACETAMINOPHEN 5-325 MG PO TABS: 1.0000 | ORAL_TABLET | ORAL | Status: DC | PRN

## 2020-01-23 MED ORDER — PRENATAL MULTIVITAMIN CH: 1.0000 | ORAL_TABLET | Freq: Every day | ORAL | Status: DC

## 2020-01-23 MED ORDER — TETANUS-DIPHTH-ACELL PERTUSSIS 5-2.5-18.5 LF-MCG/0.5 IM SUSY
0.5000 mL | PREFILLED_SYRINGE | Freq: Once | INTRAMUSCULAR | Status: AC
  Administered 2020-01-25: 0.5 mL via INTRAMUSCULAR
  Filled 2020-01-23: qty 0.5

## 2020-01-23 MED ORDER — TERBUTALINE SULFATE 1 MG/ML IJ SOLN: 0.2500 mg | Freq: Once | INTRAMUSCULAR | Status: DC | PRN

## 2020-01-23 MED ORDER — SIMETHICONE 80 MG PO CHEW: 80.0000 mg | CHEWABLE_TABLET | ORAL | Status: DC | PRN

## 2020-01-23 MED ORDER — WITCH HAZEL-GLYCERIN EX PADS: 1.0000 "application " | MEDICATED_PAD | CUTANEOUS | Status: DC | PRN

## 2020-01-23 MED ORDER — COCONUT OIL OIL: 1.0000 "application " | TOPICAL_OIL | Status: DC | PRN

## 2020-01-23 MED ORDER — SODIUM CHLORIDE (PF) 0.9 % IJ SOLN
INTRAMUSCULAR | Status: DC | PRN
  Administered 2020-01-23: 12 mL/h via EPIDURAL

## 2020-01-23 MED ORDER — ONDANSETRON HCL 4 MG/2ML IJ SOLN: 4.0000 mg | INTRAMUSCULAR | Status: DC | PRN

## 2020-01-23 MED ORDER — ZOLPIDEM TARTRATE 5 MG PO TABS: 5.0000 mg | ORAL_TABLET | Freq: Every evening | ORAL | Status: DC | PRN

## 2020-01-23 MED ORDER — MEASLES, MUMPS & RUBELLA VAC IJ SOLR: 0.5000 mL | Freq: Once | INTRAMUSCULAR | Status: DC

## 2020-01-23 MED ORDER — ACETAMINOPHEN 325 MG PO TABS: 650.0000 mg | ORAL_TABLET | ORAL | Status: DC | PRN

## 2020-01-23 MED ORDER — LIDOCAINE HCL (PF) 1 % IJ SOLN
INTRAMUSCULAR | Status: DC | PRN
  Administered 2020-01-23 (×2): 5 mL via EPIDURAL

## 2020-01-23 MED ORDER — IBUPROFEN 600 MG PO TABS
600.0000 mg | ORAL_TABLET | Freq: Four times a day (QID) | ORAL | Status: DC
  Administered 2020-01-23 – 2020-01-25 (×8): 600 mg via ORAL
  Filled 2020-01-23 (×8): qty 1

## 2020-01-23 MED ORDER — INSULIN ASPART 100 UNIT/ML ~~LOC~~ SOLN
0.0000 [IU] | Freq: Every day | SUBCUTANEOUS | Status: DC
  Administered 2020-01-23: 21:00:00 3 [IU] via SUBCUTANEOUS

## 2020-01-23 MED ORDER — MAGNESIUM HYDROXIDE 400 MG/5ML PO SUSP: 30.0000 mL | ORAL | Status: DC | PRN

## 2020-01-23 MED ORDER — ACETAMINOPHEN 325 MG PO TABS
650.0000 mg | ORAL_TABLET | ORAL | Status: DC | PRN
  Administered 2020-01-23: 650 mg via ORAL
  Filled 2020-01-23: qty 2

## 2020-01-23 MED ORDER — TRANEXAMIC ACID-NACL 1000-0.7 MG/100ML-% IV SOLN
1000.0000 mg | Freq: Once | INTRAVENOUS | Status: AC
  Administered 2020-01-23: 14:00:00 1000 mg via INTRAVENOUS
  Filled 2020-01-23: qty 100

## 2020-01-23 MED ORDER — DIPHENHYDRAMINE HCL 25 MG PO CAPS: 25.0000 mg | ORAL_CAPSULE | Freq: Four times a day (QID) | ORAL | Status: DC | PRN

## 2020-01-23 MED ORDER — OXYTOCIN-SODIUM CHLORIDE 30-0.9 UT/500ML-% IV SOLN
1.0000 m[IU]/min | INTRAVENOUS | Status: DC
  Administered 2020-01-23: 04:00:00 2 m[IU]/min via INTRAVENOUS
  Administered 2020-01-23: 06:00:00 6 m[IU]/min via INTRAVENOUS

## 2020-01-23 MED ORDER — BENZOCAINE-MENTHOL 20-0.5 % EX AERO: 1.0000 "application " | INHALATION_SPRAY | CUTANEOUS | Status: DC | PRN

## 2020-01-23 MED ORDER — FERROUS SULFATE 325 (65 FE) MG PO TABS: 325.0000 mg | ORAL_TABLET | Freq: Two times a day (BID) | ORAL | Status: DC

## 2020-01-23 MED ORDER — IBUPROFEN 600 MG PO TABS: 600.0000 mg | ORAL_TABLET | Freq: Four times a day (QID) | ORAL | Status: DC

## 2020-01-23 MED ORDER — OXYTOCIN-SODIUM CHLORIDE 30-0.9 UT/500ML-% IV SOLN
INTRAVENOUS | Status: AC
  Filled 2020-01-23: qty 500

## 2020-01-23 MED ORDER — ONDANSETRON HCL 4 MG PO TABS: 4.0000 mg | ORAL_TABLET | ORAL | Status: DC | PRN

## 2020-01-23 MED ORDER — PRENATAL MULTIVITAMIN CH
1.0000 | ORAL_TABLET | Freq: Every day | ORAL | Status: DC
  Administered 2020-01-24 – 2020-01-25 (×2): 1 via ORAL
  Filled 2020-01-23 (×2): qty 1

## 2020-01-23 MED ORDER — INSULIN ASPART 100 UNIT/ML ~~LOC~~ SOLN
0.0000 [IU] | Freq: Three times a day (TID) | SUBCUTANEOUS | Status: DC
  Administered 2020-01-24: 15:00:00 3 [IU] via SUBCUTANEOUS
  Administered 2020-01-24: 11:00:00 8 [IU] via SUBCUTANEOUS
  Administered 2020-01-24: 21:00:00 3 [IU] via SUBCUTANEOUS
  Administered 2020-01-25: 5 [IU] via SUBCUTANEOUS
  Administered 2020-01-25: 2 [IU] via SUBCUTANEOUS

## 2020-01-23 MED ORDER — TETANUS-DIPHTH-ACELL PERTUSSIS 5-2.5-18.5 LF-MCG/0.5 IM SUSY: 0.5000 mL | PREFILLED_SYRINGE | Freq: Once | INTRAMUSCULAR | Status: DC

## 2020-01-23 NOTE — Progress Notes (Signed)
Labor Progress Note [name] is a 34 y.o. [phone] at [redacted]w[redacted]d presented for IOL-uncontrolled T2DM.  S: Doing well without complaints.  O:  BP (!) 144/68   Pulse (!) 56   Temp 98.4 F (36.9 C) (Oral)   Resp 18   Ht 4\' 10"  (1.473 m)   Wt 92.2 kg   LMP 04/28/2019   BMI 42.49 kg/m  EFM: 150bpm/mod variability/+ accels Toco: q1-5 min  CVE: Dilation: 4 Effacement (%): 50 Station: -2 Presentation: Vertex Exam by:: A. Day, RN   A&P: 34 y.o. 31 [redacted]w[redacted]d presented for IOL-uncontrolled T2DM. #IOL: S/p cytotec x2. Continue to titrate pitocin. #Pain: PRN, desires epidural #FWB: cat 1 #GBS negative by PCR on arrival  #T2DM: uncontrolled, has not checked BGL the past couple of days. On metformin and insulin at home. Given BGL, have not started meds since admission. BGL q4 hours and initiate insulin if necessary.  #gHTN: elevated BP in clinic today and on admit, meeting criteria for gHTN. Asymptomatic. PreE labs normal.  #History of PPH: txa at delivery  [redacted]w[redacted]d, MD 7:09 AM

## 2020-01-23 NOTE — Lactation Note (Signed)
This note was copied from a baby's chart. Lactation Consultation Note  Patient Name: [name] Today's Date: 01/23/2020 Reason for consult: L&D Initial assessment Age:36 hours  L&D consult with 37 minutes old infant and [name]. Congratulated mother on newborn. Assisted with skin to skin prone on mother's chest. Discussed STS as ideal transition for infants after birth helping with temperature, blood sugar and comfort. Talked about primal reflexes such as rooting, hands to mouth, searching for the breast among others.   No latch or hand expression assistance at this time. Explained LC services availability during postpartum stay. Thanked family for their time.    Maternal Data Formula Feeding for Exclusion: Yes Reason for exclusion: Mother's choice to formula and breast feed on admission Does the patient have breastfeeding experience prior to this delivery?: No  Feeding Feeding Type: Breast Fed  LATCH Score Latch: Too sleepy or reluctant, no latch achieved, no sucking elicited.  Audible Swallowing: None  Type of Nipple: Everted at rest and after stimulation  Comfort (Breast/Nipple): Soft / non-tender  Hold (Positioning): Assistance needed to correctly position infant at breast and maintain latch.  LATCH Score: 5  Interventions Interventions: Breast feeding basics reviewed;Skin to skin  Consult Status Consult Status: Follow-up Date: 01/24/20 Follow-up type: Call as needed    Yeraldin Litzenberger A Higuera Ancidey 01/23/2020, 2:52 PM

## 2020-01-23 NOTE — Discharge Summary (Signed)
Postpartum Discharge Summary    Patient Name: Margene Cherian DOB: 02/13/1985 MRN: [id number]  Date of admission: 01/22/2020 Delivery date:01/23/2020  Delivering provider: Manya Silvas  Date of discharge: 01/25/2020  Admitting diagnosis: Indication for care in labor or delivery [O75.9] Intrauterine pregnancy: [redacted]w[redacted]d    Secondary diagnosis:  Principal Problem:   Vaginal delivery Active Problems:   Diabetes mellitus, type 2 ([place]   Pre-existing type 2 diabetes mellitus during pregnancy, antepartum   History of postpartum hemorrhage   Supervision of high risk pregnancy, antepartum   Indication for care in labor or delivery   Gestational hypertension   Language barrier  Additional problems: as noted above   Discharge diagnosis: Term Pregnancy Delivered, Gestational Hypertension and Type 2 DM                                              Post partum procedures:depo Augmentation: AROM, Pitocin, Cytotec and IP Foley Complications: Placental Abruption  Hospital course: Induction of Labor With Vaginal Delivery   34y.o. yo [id number] admitted to the hospital 01/22/2020 for induction of labor.  Indication for induction: Gestational hypertension and uncontrolled TYPE 2 DM.  Patient had a labor course complicated by placental abruption: Membrane Rupture Time/Date: 1:12 PM ,01/23/2020   Delivery Method:Vaginal, Spontaneous  Episiotomy: None  Lacerations:  None  Details of delivery can be found in separate delivery note.  Patient had an uncomplicated postpartum course. Patient is discharged home 01/25/20. She was discharged home on metformin 1g BID given elevated BG levels in postpartum period. She was also started on norvasc 63m40maily for elevated blood pressures.  Newborn Data: Birth date:01/23/2020  Birth time:1:53 PM  Gender:Female  Living status:Living  Apgars:9 ,9  Weight:3317 g   Magnesium Sulfate received: No BMZ received: No Rhophylac:N/A MMR:N/A T-DaP:  offered prior to dishcarge Flu: offered prior to discharge Transfusion:No  Physical exam  Vitals:   01/24/20 0452 01/24/20 1435 01/24/20 2044 01/25/20 0606  BP: 137/88 137/87 128/76 134/78  Pulse: 62 74 81 63  Resp: 16 18 18 18   Temp: 98.3 F (36.8 C) 98.6 F (37 C) 98.5 F (36.9 C) 98.5 F (36.9 C)  TempSrc: Oral Oral Oral Oral  SpO2: 99%  100% 100%  Weight:      Height:       General: alert, cooperative and no distress Lochia: appropriate Uterine Fundus: firm Incision: N/A DVT Evaluation: No evidence of DVT seen on physical exam. No cords or calf tenderness. No significant calf/ankle edema. Labs: Lab Results  Component Value Date   WBC 12.0 (H) 01/24/2020   HGB 9.3 (L) 01/24/2020   HCT 29.0 (L) 01/24/2020   MCV 81.7 01/24/2020   PLT 174 01/24/2020   CMP Latest Ref Rng & Units 01/22/2020  Glucose 70 - 99 mg/dL 85  BUN 6 - 20 mg/dL 14  Creatinine 0.44 - 1.00 mg/dL 0.75  Sodium 135 - 145 mmol/L 136  Potassium 3.5 - 5.1 mmol/L 4.0  Chloride 98 - 111 mmol/L 106  CO2 22 - 32 mmol/L 21(L)  Calcium 8.9 - 10.3 mg/dL 8.4(L)  Total Protein 6.5 - 8.1 g/dL 5.1(L)  Total Bilirubin 0.3 - 1.2 mg/dL 0.1(L)  Alkaline Phos 38 - 126 U/L 189(H)  AST 15 - 41 U/L 24  ALT 0 - 44 U/L 21   Edinburgh Score: No flowsheet  data found.   After visit meds:  Allergies as of 01/25/2020   No Known Allergies     Medication List    STOP taking these medications   insulin aspart 100 UNIT/ML injection Commonly known as: novoLOG     TAKE these medications   acetaminophen 325 MG tablet Commonly known as: Tylenol Take 2 tablets (650 mg total) by mouth every 6 (six) hours as needed for mild pain, moderate pain, fever or headache.   amLODipine 5 MG tablet Commonly known as: NORVASC Take 1 tablet (5 mg total) by mouth daily.   coconut oil Oil Apply 1 application topically as needed (nipple pain).   ferrous sulfate 325 (65 FE) MG tablet Take 1 tablet (325 mg total) by mouth every  other day.   ibuprofen 600 MG tablet Commonly known as: ADVIL Take 1 tablet (600 mg total) by mouth every 8 (eight) hours as needed for moderate pain or cramping.   metFORMIN 1000 MG tablet Commonly known as: GLUCOPHAGE Take 1 tablet (1,000 mg total) by mouth 2 (two) times daily with a meal. What changed:   medication strength  how much to take   prenatal vitamin w/FE, FA 27-1 MG Tabs tablet Take 1 tablet by mouth daily at 12 noon.        Discharge home in stable condition Infant Feeding: No evidence of DVT seen on physical exam. No cords or calf tenderness. No significant calf/ankle edema. Infant Disposition:home with mother Discharge instruction: per After Visit Summary and Postpartum booklet. Activity: Advance as tolerated. Pelvic rest for 6 weeks.  Diet: routine diet Future Appointments: Future Appointments  Date Time Provider Department Center  01/29/2020  9:20 AM [facility] NURSE [facility]  02/21/2020  9:55 AM Gala Romney, Fredderick Phenix, MD [facility]   Follow up Visit:   Please schedule this patient for a In person postpartum visit in 4 weeks with the following provider: Any provider Additional Postpartum F/U:BP check 2-3 days at [facility] for women High risk pregnancy complicated by: Uncontrolled type 2 diabetes and gestational hypertension Delivery mode:  Vaginal, Spontaneous  Anticipated Birth Control: Depo > outpatient IUD   Randa Ngo, MD OB Fellow, Faculty Practice 01/25/2020 6:22 AM

## 2020-01-23 NOTE — Progress Notes (Signed)
[name] is a 34 y.o. [phone] at [redacted]w[redacted]d.  Subjective: Comfortable with epidural  Objective: BP 119/85   Pulse 74   Temp 98.1 F (36.7 C) (Oral)   Resp 18   Ht 4\' 10"  (1.473 m)   Wt 92.2 kg   LMP 04/28/2019   BMI 42.49 kg/m    FHT:  FHR: 150 bpm, variability: Moderate,  accelerations: 15 X 15,  decelerations: Few variables UC:   Q 2-3 minutes, moderate-strong Dilation: 7.5 Effacement (%): 80 Station: -2 Presentation: Vertex Exam by:: [email or web address], RNC  Labs: N/A  Assessment / Plan: [redacted]w[redacted]d week IUP.  Labor: Transition Fetal Wellbeing:  Category II, but overall reassuring Pain Control: Epidural Anticipated MOD: We will proceed with plan for vaginal delivery since patient is now progressing quickly. Uncontrolled type 2 diabetes suspected fetal macrosomia: Planned growth ultrasound, but will cancel due to progressing labor, likelihood of getting accurate images at this point.  Will be prepared for possible shoulder dystocia.  [redacted]w[redacted]d, Katrinka Blazing, CNM 01/23/2020 1:16 PM

## 2020-01-23 NOTE — Plan of Care (Signed)
Taleia Sadowski, RN 

## 2020-01-23 NOTE — Progress Notes (Signed)
34 yo [id number] at 37+2 here iol for uncontrolled pre-gestational dm, also newly-diagnosed mild gestational hypertnesion. Small amount of bleeding noted last few hours. Category 1 tracing. On exam 7-8/90/-2, AROM performed clear. Given cervical change think bleeding most likely 2/2 that, will manage expectantly for now given reassuring fetal status.

## 2020-01-23 NOTE — Progress Notes (Signed)
[name] is a 34 y.o. [phone] at [redacted]w[redacted]d.  Subjective: Called to bedside for late decelerations. Patient reports sharp upper abdominal pain.  Although ultrasound order had been canceled, the sonographer was already performing ultrasound.  Informal reported that abruption was seen.  Fundal placenta.  Ultrasound canceled due to visible fetal heart rate decelerations.   Interpreter at bedside.  Objective: BP (!) 106/59   Pulse 62   Temp 98.1 F (36.7 C) (Oral)   Resp 18   Ht 4\' 10"  (1.473 m)   Wt 92.2 kg   LMP 04/28/2019   BMI 42.49 kg/m    FHT:  FHR: 150 bpm, variability: Moderate,  accelerations: None,  Decelerations: Repetitive late decelerations UC:   Q 2-3 minutes, strong Dilation: 10 Dilation Complete Date: 01/23/20 Dilation Complete Time: 1351 Effacement (%): 80 Station: Plus 2 Presentation: Vertex Exam by:: V Kabe Mckoy CNM  Labs: CBG 145  Assessment / Plan: [redacted]w[redacted]d week IUP Labor: Start second stage Fetal Wellbeing:  Category II.  Pitocin discontinued.  IV bolus and position changes performed.  Delivery eminent. Pain Control: Epidural Anticipated MOD: SVD versus possible vacuum. Placental abruption: Vital signs stable, delivery eminent.  NICU called.  To call attending if not delivered in next contraction.  [redacted]w[redacted]d, [name], CNM 01/23/2020 1:40 PM

## 2020-01-23 NOTE — Progress Notes (Signed)
Labor Progress Note [name] is a 34 y.o. [phone] at [redacted]w[redacted]d presented for IOL-uncontrolled T2DM.  S: Strip reviewed  O:  BP 135/81   Pulse (!) 52   Temp 98.5 F (36.9 C) (Oral)   Resp 16   Ht 4\' 10"  (1.473 m)   Wt 92.2 kg   LMP 04/28/2019   BMI 42.49 kg/m  EFM: 130bpm/mod variability/+ accels Toco: q1-5 min  CVE: Dilation: 4 Effacement (%): 50 Station: -2 Presentation: Vertex Exam by:: A. Day, RN   A&P: 34 y.o. 31 [redacted]w[redacted]d presented for IOL-uncontrolled T2DM. #IOL: S/p cytotec x2. Given cervical exam will start pitocin. Continue to titrate. #Pain: PRN, desires epidural #FWB: cat 1 #GBS negative by PCR on arrival  #T2DM: uncontrolled, has not checked BGL the past couple of days. On metformin and insulin at home. Given BGL, have not started meds since admission. BGL q4 hours and initiate insulin if necessary.  #gHTN: elevated BP in clinic today and on admit, meeting criteria for gHTN. Asymptomatic. PreE labs normal.  #History of PPH: txa at delivery  [redacted]w[redacted]d, MD 5:06 AM

## 2020-01-23 NOTE — Anesthesia Preprocedure Evaluation (Signed)
Anesthesia Evaluation  Patient identified by MRN, date of birth, ID band Patient awake    Reviewed: Allergy & Precautions, H&P , NPO status , Patient's Chart, lab work & pertinent test results  History of Anesthesia Complications Negative for: history of anesthetic complications  Airway Mallampati: II  TM Distance: >3 FB Neck ROM: full    Dental no notable dental hx. (+) Teeth Intact   Pulmonary neg pulmonary ROS,    Pulmonary exam normal breath sounds clear to auscultation       Cardiovascular hypertension, Normal cardiovascular exam Rhythm:regular Rate:Normal     Neuro/Psych negative neurological ROS  negative psych ROS   GI/Hepatic negative GI ROS, Neg liver ROS,   Endo/Other  diabetes, Oral Hypoglycemic Agents, Insulin DependentMorbid obesity  Renal/GU negative Renal ROS  negative genitourinary   Musculoskeletal   Abdominal (+) + obese,   Peds  Hematology  (+) Blood dyscrasia, anemia ,   Anesthesia Other Findings   Reproductive/Obstetrics (+) Pregnancy                             Anesthesia Physical Anesthesia Plan  ASA: III  Anesthesia Plan: Epidural   Post-op Pain Management:    Induction:   PONV Risk Score and Plan:   Airway Management Planned:   Additional Equipment:   Intra-op Plan:   Post-operative Plan:   Informed Consent: I have reviewed the patients History and Physical, chart, labs and discussed the procedure including the risks, benefits and alternatives for the proposed anesthesia with the patient or authorized representative who has indicated his/her understanding and acceptance.       Plan Discussed with:   Anesthesia Plan Comments:         Anesthesia Quick Evaluation

## 2020-01-23 NOTE — Progress Notes (Signed)
[name] is a 34 y.o. [phone] at [redacted]w[redacted]d.  Subjective: Pt comfortable w/ epidural  Objective: BP 117/74   Pulse 89   Temp 98.1 F (36.7 C) (Oral)   Resp 18   Ht 4\' 10"  (1.473 m)   Wt 92.2 kg   LMP 04/28/2019   BMI 42.49 kg/m    FHT:  FHR: 140 bpm, variability: mod,  accelerations:  15x15,  decelerations:  none UC:   Q 2-3 minutes, mod Dilation: 4 Effacement (%): Thick Station: Ballotable Presentation: Vertex Exam by:: [email or web address] CNM Passed 4 cm clot, heavier than normal bloody show.  Vtx too high to safely ROM Leopolds 8.5-9 lb. No engaged in pelvis.   Labs: Results for orders placed or performed during the hospital encounter of 01/22/20 (from the past 24 hour(s))  CBC     Status: Abnormal   Collection Time: 01/22/20  6:40 PM  Result Value Ref Range   WBC 8.9 4.0 - 10.5 K/uL   RBC 4.36 3.87 - 5.11 MIL/uL   Hemoglobin 11.6 (L) 12.0 - 15.0 g/dL   HCT 01/24/20 (L) 51.7 - 00.1 %   MCV 81.2 80.0 - 100.0 fL   MCH 26.6 26.0 - 34.0 pg   MCHC 32.8 30.0 - 36.0 g/dL   RDW 74.9 44.9 - 67.5 %   Platelets 251 150 - 400 K/uL   nRBC 0.0 0.0 - 0.2 %  RPR     Status: None   Collection Time: 01/22/20  6:40 PM  Result Value Ref Range   RPR Ser Ql NON REACTIVE NON REACTIVE  Type and screen [facility]     Status: None   Collection Time: 01/22/20  6:54 PM  Result Value Ref Range   ABO/RH(D) O POS    Antibody Screen NEG    Sample Expiration      01/25/2020,2359 Performed at [facility], [address] [place], [place]   Glucose, capillary     Status: Abnormal   Collection Time: 01/22/20  7:27 PM  Result Value Ref Range   Glucose-Capillary 110 (H) 70 - 99 mg/dL  SARS CORONAVIRUS 2 (TAT 6-24 HRS) Nasopharyngeal Nasopharyngeal Swab     Status: None   Collection Time: 01/22/20  7:37 PM   Specimen: Nasopharyngeal Swab  Result Value Ref Range   SARS Coronavirus 2 NEGATIVE NEGATIVE  Group B strep by PCR     Status: None   Collection Time:  01/22/20  7:52 PM   Specimen: Vaginal/Rectal; Genital  Result Value Ref Range   Group B strep by PCR NEGATIVE NEGATIVE  Comprehensive metabolic panel     Status: Abnormal   Collection Time: 01/22/20  8:54 PM  Result Value Ref Range   Sodium 136 135 - 145 mmol/L   Potassium 4.0 3.5 - 5.1 mmol/L   Chloride 106 98 - 111 mmol/L   CO2 21 (L) 22 - 32 mmol/L   Glucose, Bld 85 70 - 99 mg/dL   BUN 14 6 - 20 mg/dL   Creatinine, Ser 01/24/20 0.44 - 1.00 mg/dL   Calcium 8.4 (L) 8.9 - 10.3 mg/dL   Total Protein 5.1 (L) 6.5 - 8.1 g/dL   Albumin 2.1 (L) 3.5 - 5.0 g/dL   AST 24 15 - 41 U/L   ALT 21 0 - 44 U/L   Alkaline Phosphatase 189 (H) 38 - 126 U/L   Total Bilirubin 0.1 (L) 0.3 - 1.2 mg/dL   GFR, Estimated 5.99 >35 mL/min  Anion gap 9 5 - 15  Hepatitis B surface antigen     Status: None   Collection Time: 01/22/20  9:58 PM  Result Value Ref Range   Hepatitis B Surface Ag NON REACTIVE NON REACTIVE  HIV Antibody (routine testing w rflx)     Status: None   Collection Time: 01/22/20  9:58 PM  Result Value Ref Range   HIV Screen 4th Generation wRfx Non Reactive Non Reactive  Protein / creatinine ratio, urine     Status: Abnormal   Collection Time: 01/22/20 10:37 PM  Result Value Ref Range   Creatinine, Urine 40.27 mg/dL   Total Protein, Urine 10 mg/dL   Protein Creatinine Ratio 0.25 (H) 0.00 - 0.15 mg/mg[Cre]  Glucose, capillary     Status: Abnormal   Collection Time: 01/22/20 11:39 PM  Result Value Ref Range   Glucose-Capillary 59 (L) 70 - 99 mg/dL  Glucose, capillary     Status: Abnormal   Collection Time: 01/23/20  3:41 AM  Result Value Ref Range   Glucose-Capillary 63 (L) 70 - 99 mg/dL   Comment 1 Notify RN    Comment 2 Call MD NNP PA CNM   Glucose, capillary     Status: None   Collection Time: 01/23/20  8:47 AM  Result Value Ref Range   Glucose-Capillary 74 70 - 99 mg/dL    Assessment / Plan: [redacted]w[redacted]d week IUP Labor: IOL, early Fetal Wellbeing:  Category I Pain Control:   Epidural Anticipated MOD:  Uncertain. Discussed concern for fetal macrosomia especially considering uncontrolled DM. Will discuss possible growth [place] w/ attending. Vaginal Bleeding: Concern for small abruption, but mom an baby stable. CTO closely.  T2DG: Hypoglycemia during the night. 74 this morning. Not an any DM meds. Will Change IV fluids to D51/2NS if low at next check and CTO closely for Sx hypoglycemia.   Katrinka Blazing, [place], CNM 01/23/2020 10:07 AM

## 2020-01-23 NOTE — Anesthesia Procedure Notes (Signed)
Epidural Patient location during procedure: OB Start time: 01/23/2020 12:06 AM End time: 01/23/2020 12:16 AM  Staffing Anesthesiologist: Leonides Grills, MD Performed: anesthesiologist   Preanesthetic Checklist Completed: patient identified, IV checked, site marked, risks and benefits discussed, monitors and equipment checked, pre-op evaluation and timeout performed  Epidural Patient position: sitting Prep: DuraPrep Patient monitoring: heart rate, cardiac monitor, continuous pulse ox and blood pressure Approach: midline Location: L4-L5 Injection technique: LOR air  Needle:  Needle type: Tuohy  Needle gauge: 17 G Needle length: 9 cm Needle insertion depth: 7 cm Catheter type: closed end flexible Catheter size: 19 Gauge Catheter at skin depth: 13 cm Test dose: negative and Other  Assessment Events: blood not aspirated, injection not painful, no injection resistance and negative IV test  Additional Notes Informed consent obtained prior to proceeding including risk of failure, 1% risk of PDPH, risk of minor discomfort and bruising. Discussed alternatives to epidural analgesia and patient desires to proceed.  Timeout performed pre-procedure verifying patient name, procedure, and platelet count.  Patient tolerated procedure well. Reason for block:procedure for pain

## 2020-01-23 NOTE — Anesthesia Postprocedure Evaluation (Signed)
Anesthesia Post Note  Patient: Juliza Machnik  Procedure(s) Performed: AN AD HOC LABOR EPIDURAL     Patient location during evaluation: Mother Baby Anesthesia Type: Epidural Level of consciousness: awake and alert Pain management: pain level controlled Vital Signs Assessment: post-procedure vital signs reviewed and stable Respiratory status: spontaneous breathing, nonlabored ventilation and respiratory function stable Cardiovascular status: stable Postop Assessment: no headache, no backache and epidural receding Anesthetic complications: no   No complications documented.  Last Vitals:  Vitals:   01/23/20 1628 01/23/20 1743  BP: 138/87 138/85  Pulse: (!) 58 (!) 56  Resp: 18 18  Temp: 37.1 C 36.9 C  SpO2: 100% 100%    Last Pain:  Vitals:   01/23/20 1743  TempSrc: Oral  PainSc:    Pain Goal:                   Fatih Stalvey

## 2020-01-24 DIAGNOSIS — O2413 Pre-existing diabetes mellitus, type 2, in the puerperium: Secondary | ICD-10-CM

## 2020-01-24 DIAGNOSIS — O135 Gestational [pregnancy-induced] hypertension without significant proteinuria, complicating the puerperium: Secondary | ICD-10-CM

## 2020-01-24 LAB — CBC
HCT: 29 % — ABNORMAL LOW (ref 36.0–46.0)
Hemoglobin: 9.3 g/dL — ABNORMAL LOW (ref 12.0–15.0)
MCH: 26.2 pg (ref 26.0–34.0)
MCHC: 32.1 g/dL (ref 30.0–36.0)
MCV: 81.7 fL (ref 80.0–100.0)
Platelets: 174 10*3/uL (ref 150–400)
RBC: 3.55 MIL/uL — ABNORMAL LOW (ref 3.87–5.11)
RDW: 15.1 % (ref 11.5–15.5)
WBC: 12 10*3/uL — ABNORMAL HIGH (ref 4.0–10.5)
nRBC: 0 % (ref 0.0–0.2)

## 2020-01-24 LAB — GLUCOSE, CAPILLARY
Glucose-Capillary: 145 mg/dL — ABNORMAL HIGH (ref 70–99)
Glucose-Capillary: 138 mg/dL — ABNORMAL HIGH (ref 70–99)
Glucose-Capillary: 253 mg/dL — ABNORMAL HIGH (ref 70–99)
Glucose-Capillary: 159 mg/dL — ABNORMAL HIGH (ref 70–99)
Glucose-Capillary: 182 mg/dL — ABNORMAL HIGH (ref 70–99)
Glucose-Capillary: 183 mg/dL — ABNORMAL HIGH (ref 70–99)

## 2020-01-24 LAB — RUBELLA SCREEN: Rubella: 15 index (ref >0.99)

## 2020-01-24 MED ORDER — AMLODIPINE BESYLATE 5 MG PO TABS
5.0000 mg | ORAL_TABLET | Freq: Every day | ORAL | Status: DC
  Administered 2020-01-24 – 2020-01-25 (×2): 5 mg via ORAL
  Filled 2020-01-24 (×2): qty 1

## 2020-01-24 MED ORDER — INFLUENZA VAC SPLIT QUAD 0.5 ML IM SUSY
0.5000 mL | PREFILLED_SYRINGE | INTRAMUSCULAR | Status: AC | PRN
  Administered 2020-01-25: 0.5 mL via INTRAMUSCULAR

## 2020-01-24 NOTE — Progress Notes (Signed)
POSTPARTUM PROGRESS NOTE  Subjective: Naysa Puskas is a 34 y.o. [phone] s/p vaginal delivery at [redacted]w[redacted]d.  She reports she doing well. No acute events overnight. She denies any problems with ambulating, voiding or po intake. Denies nausea or vomiting. She has passed flatus. Pain is well controlled.  Lochia is minimal.  Objective: Blood pressure 137/88, pulse 62, temperature 98.3 F (36.8 C), temperature source Oral, resp. rate 16, height 4\' 10"  (1.473 m), weight 92.2 kg, last menstrual period 04/28/2019, SpO2 99 %, unknown if currently breastfeeding.  Physical Exam:  General: alert, cooperative and no distress Chest: no respiratory distress Abdomen: soft, non-tender  Uterine Fundus: firm and at level of umbilicus Extremities: No calf swelling or tenderness  no LE edema  Recent Labs    01/22/20 1840 01/24/20 0531  HGB 11.6* 9.3*  HCT 35.4* 29.0*    Assessment/Plan: Ziyan Hillmer is a 34 y.o. [phone] s/p vaginal delivery at [redacted]w[redacted]d for uncontrolled T2DM and gHTN.  Routine Postpartum Care: Doing well, pain well-controlled.  -- Continue routine care, lactation support  -- Contraception: undecided -- Feeding: breast & bottle -- gHTN: Pt with persistent mild range blood pressures s/p delivery. Asymptomatic. Start norvasc 5mg  daily. -- Uncontrolled T2DM: Pt reports insulin and MTF prior to pregnancy but no current PCP. Pt currently taking metformin 1g BID. Given elevated blood glucose levels, will continue to monitor and treated with SSI as needed. Will continue to monitor insulin needs today to determine if pt warrants insulin treatment on discharge.  Dispo: Plan for discharge PPD#2.  [redacted]w[redacted]d, MD OB Fellow, Faculty Practice 01/24/2020 9:46 AM

## 2020-01-24 NOTE — Lactation Note (Signed)
This note was copied from a baby's chart. Lactation Consultation Note RN Royden Purl interpret for [facility]. Baby 12 hrs old. Baby will not latch on the breast. Baby bites nipple when nipple in mouth.  Baby has uncoordinated suck on mom/s nipple. Mom has Large everted nipples. Baby has small mouth. Nipple goes into mouth but LC not able to see if baby can obtain any areola.nipple fills mouth. Mention to mom try for couple of days to latch baby d/t baby is still learning to feed. If nipple ends up to large for baby then mom can pump for a week or so until baby can latch to both areola and nipple w/o biting or gagging baby. Mom didn't BF her other children. She gave then formula.  Discussed w/mom pumping for stimulation every 3 hrs. Still try to put baby on the breast. Supplement afterwards.   Mom encouraged to feed baby 8-12 times/24 hours and with feeding cues. Mom shown how to use DEBP & how to disassemble, clean, & reassemble parts. Mom knows to pump q3h for 15-20 min.  Mom wants to file for [facility]. LC send referral to [facility]. Encouraged mom to call for assistance or questions. Lactation brochure given in Spanish.  Patient Name: [name] Today's Date: 01/24/2020 Reason for consult: Initial assessment;1st time breastfeeding;Early term 37-38.6wks Age:53 hours  Maternal Data Does the patient have breastfeeding experience prior to this delivery?: No  Feeding Feeding Type: Bottle Fed - Formula Nipple Type: Slow - flow  LATCH Score Latch: Too sleepy or reluctant, no latch achieved, no sucking elicited.  Audible Swallowing: None  Type of Nipple: Everted at rest and after stimulation  Comfort (Breast/Nipple): Soft / non-tender  Hold (Positioning): Full assist, staff holds infant at breast  LATCH Score: 4  Interventions Interventions: Breast feeding basics reviewed;Assisted with latch;Breast compression;Skin to skin;Adjust position;Breast massage;Support pillows;Hand  pump;Hand express;Position options;DEBP  Lactation Tools Discussed/Used WIC Program: No (wants wic) Pump Education: Setup, frequency, and cleaning;Milk Storage Initiated by:: Peri Jefferson RN IBCLC Date initiated:: 01/24/20   Consult Status Consult Status: Follow-up Date: 01/24/20 Follow-up type: In-patient    [name], Diamond Nickel 01/24/2020, 2:50 AM

## 2020-01-24 NOTE — Clinical Social Work Maternal (Signed)
CLINICAL SOCIAL WORK MATERNAL/CHILD NOTE  Patient Details  Name: [name] MRN: [id number] Date of Birth: [birth year]  Date:  01/24/2020  Clinical Social Worker Initiating Note:  Darra Lis, MSW, [place] Date/Time: Initiated:  01/24/20/0900     Child's Name:  [name]   Biological Parents:  Mother   Need for Interpreter:  None   Reason for Referral:  Late or No Prenatal Care    Address:  [address] [place] [address]    Phone number:  [phone] (home) [phone] (work)    Additional phone number:   Household Members/Support Persons (HM/SP):   Household Member/Support Person 1,Household Member/Support Person 2,Household Member/Support Person 3,Household Member/Support Person 4,Household Member/Support Person 5   HM/SP Name Relationship DOB or Age  HM/SP -[place] Adult  HM/SP -[place] Cousin's partner Unknown Adult  HM/SP -3 Forde Radon Daughter 09/03/2014  HM/SP -4 Hollace Hayward Son 05/19/2012  HM/SP -5 Hollace Hayward Spouse 03/22/1968  HM/SP -6        HM/SP -7        HM/SP -8          Natural Supports (not living in the home):  Spouse/significant other   Professional Supports: None   Employment: Unemployed   Type of Work:     Education:  9 to 11 years   Homebound arranged:    Museum/gallery curator Resources:      Other Resources:      Cultural/Religious Considerations Which May Impact Care:    Strengths:  Ability to meet basic needs ,Home prepared for child    Psychotropic Medications:         Pediatrician:       Pediatrician List:   [facility]      Pediatrician Fax Number:    Risk Factors/Current Problems:  None   Cognitive State:  Alert    Mood/Affect:  Calm ,Interested    CSW Assessment: CSW consulted for limited prenatal care. CSW met with MOB to offer support and complete  assessment. CSW used interpreter Angela Nevin [phone]. CSW introduced self and role. CSW observed baby in bassinet and FOB bedside. CSW asked MOB if she would like to speak alone for privacy, MOB declined. CSW informed MOB of reason for consult. MOB expressed understanding. MOB reported she received prenatal care in [place] the entire pregnancy. MOB reported she resides with FOB, children and relatives. CSW asked MOB if all of her children reside in [place]. MOB reported all of her children live here. MOB currently does not work or receive [place] or food stamps. MOB provided permission for CSW to contact [facility] while in the room, to have a [facility] appointment scheduled. CSW asked MOB if she would like the financial navigator to be contacted in reference to the lack of insurance. MOB was agreeable. MOB reported she does not have any mental health diagnosis and is currently feeling good. MOB identified FOB as being supportive. MOB denies any current SI or HI.  CSW provided education regarding the baby blues period vs. perinatal mood disorders and discussed treatment if mental health follow up if concerns arise.  CSW recommends self-evaluation during the postpartum time period using the New Mom Checklist from Postpartum Progress and encouraged MOB to contact a medical professional if symptoms are noted at any time.   CSW provided  review of Sudden Infant Death Syndrome (SIDS) precautions.  MOB reported baby will sleep in a crib. FOB stated they have all of the essential needs for baby, including a car seat. MOB is still in the process of locating a pediatrician for baby and denies any transportation barriers.   CSW contacted financial counselor for follow-up with MOB. A WIC appointment was scheduled for MOB. CSW identifies no further need for intervention and no barriers to discharge at this time.   CSW Plan/Description:  No Further Intervention Required/No Barriers to Discharge,Perinatal Mood and Anxiety Disorder (PMADs)  Education,Sudden Infant Death Syndrome (SIDS) Education,Other Information/Referral to [employer], [place] 01/24/2020, 10:04 AM

## 2020-01-24 NOTE — Lactation Note (Signed)
This note was copied from a baby's chart. Lactation Consultation Note  Patient Name: [name] Today's Date: 01/24/2020   Age:36 hours  Via Spanish interpreter [name].  Baby [name] now 70 hours old.  Fussy on arrival.  Mom reports she is trying to poop.  Mom reports she has started breastfeeding better today. Mom has been pumping and giving formula.  Mom does not have a breastpump for home use.  Urged mom to put her STS.  Mom did and [name] went to the breast and latched for a few minutes and came off ands was fussy again. Unable to find moms manual pump in her kit and mom reports no one showed her how to use it or told her about it.  Mom reports she has used one before with a previous child.   Demo how to use manual pump and mom reports painful. Moms nipples sore she reports.  Discussed hand expression .  Mom reports she does not know how.  Demo how to hand express and mom demo back.  Urged her to hand express and rub expressed mothers milk on nipples and air dry.  Discussed using coconut oil on nipples and with pumping as well.  LC gave mom coconut oil to use.  Mom reports she has been feeding her every 3-4 hours as instructed.  However white board says 8-12 x day in [place].  Reviewed feeding [name] based on cues and 8-12 or more times day.  Urged mom not to go longer than 4 hours without feeding her if [name] did not cue. Praised breastfeeding efforts.  Urged mom to call lactation as needed.  Veola Cafaro S Rylinn Linzy 01/24/2020, 5:28 PM

## 2020-01-25 ENCOUNTER — Encounter (HOSPITAL_COMMUNITY): Payer: Self-pay | Admitting: Obstetrics & Gynecology

## 2020-01-25 ENCOUNTER — Other Ambulatory Visit (HOSPITAL_COMMUNITY): Payer: Self-pay | Admitting: Obstetrics and Gynecology

## 2020-01-25 LAB — GLUCOSE, CAPILLARY
Glucose-Capillary: 126 mg/dL — ABNORMAL HIGH (ref 70–99)
Glucose-Capillary: 220 mg/dL — ABNORMAL HIGH (ref 70–99)
Glucose-Capillary: 238 mg/dL — ABNORMAL HIGH (ref 70–99)

## 2020-01-25 MED ORDER — INFLUENZA VAC SPLIT QUAD 0.5 ML IM SUSY
0.5000 mL | PREFILLED_SYRINGE | INTRAMUSCULAR | Status: DC
  Administered 2020-01-25: 0.5 mL via INTRAMUSCULAR

## 2020-01-25 MED ORDER — FERROUS SULFATE 325 (65 FE) MG PO TABS: 325.0000 mg | ORAL_TABLET | ORAL | 1 refills | Status: DC

## 2020-01-25 MED ORDER — ACETAMINOPHEN 325 MG PO TABS: 650.0000 mg | ORAL_TABLET | Freq: Four times a day (QID) | ORAL | 0 refills | Status: AC | PRN

## 2020-01-25 MED ORDER — METFORMIN HCL 1000 MG PO TABS: 1000.0000 mg | ORAL_TABLET | Freq: Two times a day (BID) | ORAL | 3 refills | Status: DC

## 2020-01-25 MED ORDER — AMLODIPINE BESYLATE 5 MG PO TABS: 5.0000 mg | ORAL_TABLET | Freq: Every day | ORAL | 0 refills | Status: DC

## 2020-01-25 MED ORDER — IBUPROFEN 600 MG PO TABS: 600.0000 mg | ORAL_TABLET | Freq: Three times a day (TID) | ORAL | 0 refills | Status: DC | PRN

## 2020-01-25 MED ORDER — COCONUT OIL OIL: 1.0000 "application " | TOPICAL_OIL | 0 refills | Status: AC | PRN

## 2020-01-25 MED ORDER — MEDROXYPROGESTERONE ACETATE 150 MG/ML IM SUSP
150.0000 mg | Freq: Once | INTRAMUSCULAR | Status: AC
  Administered 2020-01-25: 150 mg via INTRAMUSCULAR
  Filled 2020-01-25: qty 1

## 2020-01-25 MED FILL — METFORMIN HCL 1000 MG TABS: 1000 | 30 days supply | Qty: 60 | Fill #0

## 2020-01-25 MED FILL — IBUPROFEN 600 MG TABLET: 600 | 10 days supply | Qty: 30 | Fill #0

## 2020-01-25 MED FILL — FERROUS SULFATE 325 MG TAB: 325 (65 FE) | 60 days supply | Qty: 30 | Fill #0

## 2020-01-25 MED FILL — AMLODIPINE BESYLATE 5 MG TA: 5 | 45 days supply | Qty: 45 | Fill #0

## 2020-01-25 NOTE — Lactation Note (Signed)
This note was copied from a baby's chart. Lactation Consultation Note  Patient Name: [name] Date: 01/25/2020 Reason for consult: Follow-up assessment Age:36 hours   P4 mother whose infant is now 77 hours old.  This is an ETI at 37+2 weeks.  Mother's feeding preference is breast/bottle.  Mother did not breast feed her other children.  Spanish interpreter [phone]) used for interpretation.  Family has been discharged.  Mother had a few questions which I answered to her satisfaction.  She will continue to latch baby and supplement with formula after discharge.  Mother is familiar with hand expression.  She has been taught how to use her manual pump and has no further questions regarding pumping.  Mother does not have a DEBP for home use but WIC has contacted her and set up an appointment to obtain a DEBP.  Encouraged putting baby to the breast first before providing any supplementation.  She will continue to do hand expression to help increase milk supply.    Mother has our OP phone number for any further questions/concerns.  RN updated.   Maternal Data    Feeding Feeding Type: Bottle Fed - Formula  LATCH Score                   Interventions    Lactation Tools Discussed/Used     Consult Status Consult Status: Complete Date: 01/25/20 Follow-up type: Call as needed    [name] R [name] 01/25/2020, 1:48 PM

## 2020-01-25 NOTE — Progress Notes (Signed)
Glucose 2 hours after lunch = 238.  MD notified. No further orders given. Patient ok to go home.

## 2020-01-25 NOTE — Progress Notes (Signed)
Patient did not call nurse to get her glucose before lunch. Glucose = 220 after eating. MD notified. Luna Kitchens stated to give 5 units of Novolog per sliding scale guidelines.

## 2020-01-25 NOTE — Progress Notes (Signed)
Completed teaching using video interpreter Hessie Knows [phone]). Discussed insulin coverage, depo provera injection, and t-dap and flu vaccines. Patient states she wants both vaccines before discharge.

## 2020-01-28 ENCOUNTER — Other Ambulatory Visit: Payer: Self-pay | Admitting: *Deleted

## 2020-01-28 ENCOUNTER — Other Ambulatory Visit: Payer: Self-pay

## 2020-01-28 DIAGNOSIS — O24119 Pre-existing diabetes mellitus, type 2, in pregnancy, unspecified trimester: Secondary | ICD-10-CM

## 2020-01-29 ENCOUNTER — Ambulatory Visit (INDEPENDENT_AMBULATORY_CARE_PROVIDER_SITE_OTHER): Payer: Self-pay | Admitting: *Deleted

## 2020-01-29 ENCOUNTER — Other Ambulatory Visit: Payer: Self-pay

## 2020-01-29 VITALS — BP 143/88 | HR 73 | Ht 60.0 in | Wt 183.1 lb

## 2020-01-29 DIAGNOSIS — O165 Unspecified maternal hypertension, complicating the puerperium: Secondary | ICD-10-CM

## 2020-01-29 MED ORDER — AMLODIPINE BESYLATE 10 MG PO TABS: 10.0000 mg | ORAL_TABLET | Freq: Every day | ORAL | 0 refills | Status: AC

## 2020-01-29 NOTE — Progress Notes (Signed)
Chart reviewed for nurse visit. Agree with plan of care.   Venora Maples, MD 01/29/20 12:28 PM

## 2020-01-29 NOTE — Progress Notes (Signed)
Here for bp check. Had IOL due to [facility] and Uncontrolled DM type 2. Delivered vaginally 01/23/20. Was d/c 01/25/20. States started norvasc Saturday and has taken daily; but not yet today because takes with food.  C/o mild headache yesterday ; it resolved without meds. States edema is less than it was. Today only trace noted. Reviewed history and assessment with Dr. Crissie Reese. Ordered to increase Norvasc to 10 mg daily and bp check one week. I explained to Christie she can take one more of the 5 mg she already has today; and then 2 of the 5mg  daily until she runs out; then start the new prescription for Norvasc 10 mg daily and then she will take one tablet daily. I also explaind she needs to make appointment at checkout today for one week bp check.  I also instructed her if she gets a severe headache unrelieved by ibuprofen or sudden severe edema she needs to be examined at the hosptial for preeclampsia. She voices understanding. Raneem Mendolia,RN

## 2020-02-05 ENCOUNTER — Ambulatory Visit: Payer: Self-pay

## 2020-02-21 ENCOUNTER — Ambulatory Visit: Payer: Self-pay | Admitting: Certified Nurse Midwife

## 2020-02-21 ENCOUNTER — Encounter: Payer: Self-pay | Admitting: Family Medicine

## 2021-05-15 IMAGING — US US MFM OB LIMITED
1 series · 15 of 21 positions shown · non-contrast
Comparison: none

[Series 1: us mfm ob limited · 21 acquisitions, 15 frames shown]
[im 1/21]
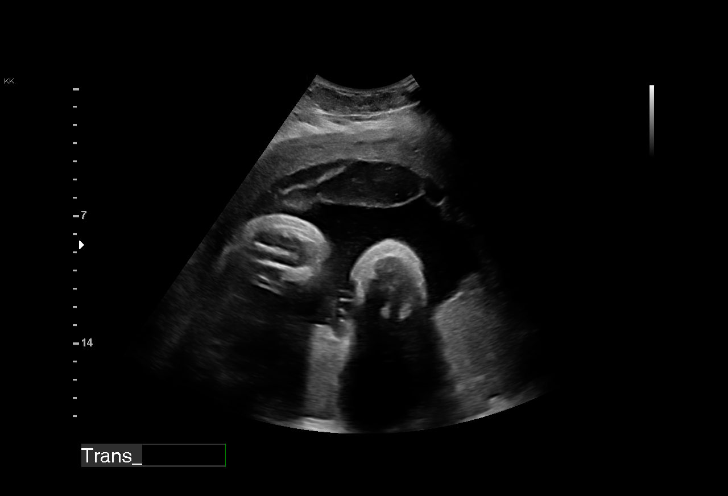
[im 3/21]
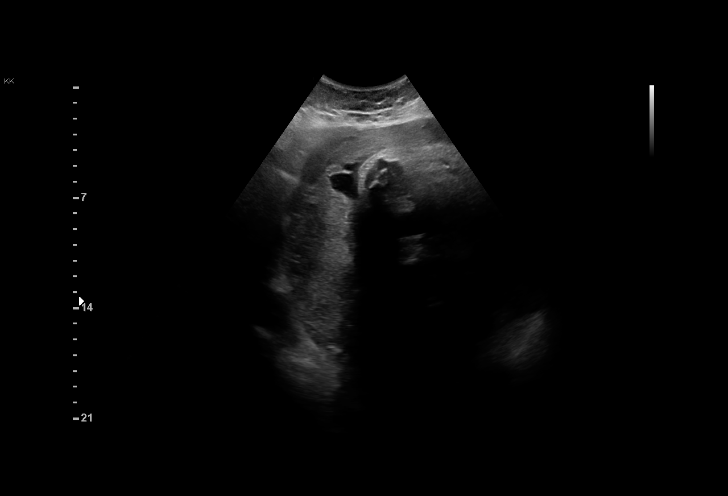
[im 4/21]
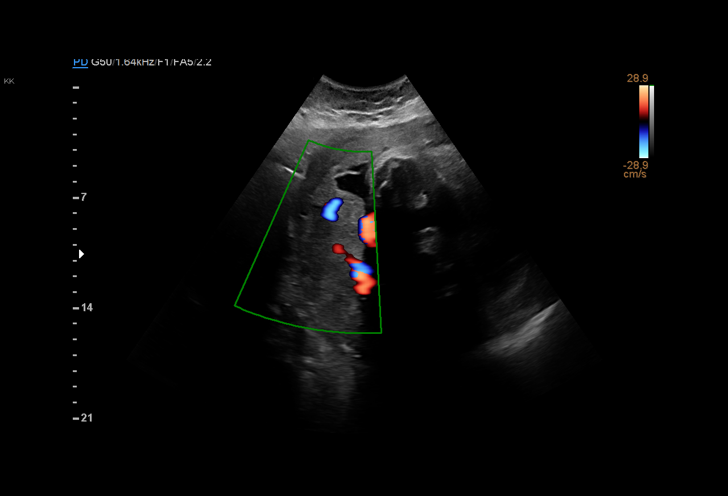
[im 5/21]
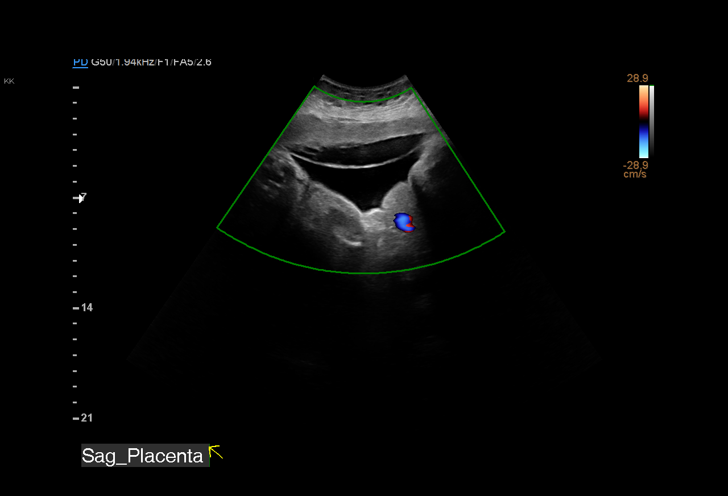
[im 7/21]
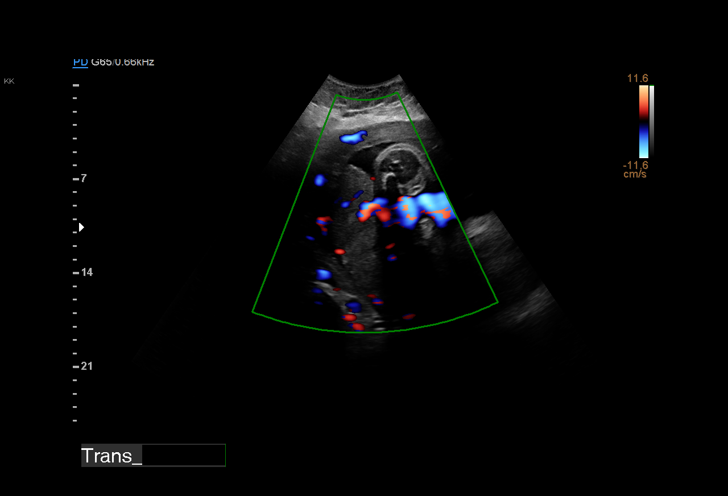
[im 8/21]
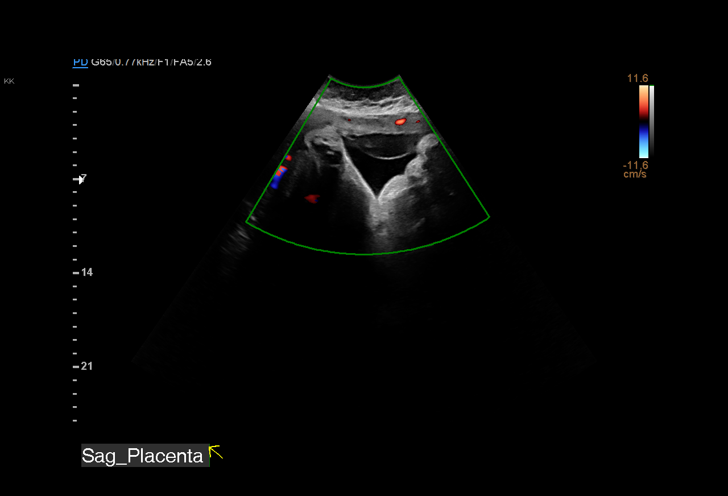
[im 10/21]
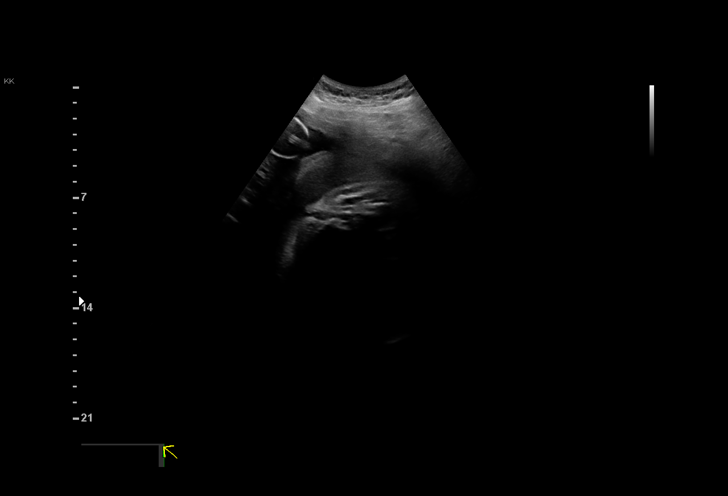
[im 11/21]
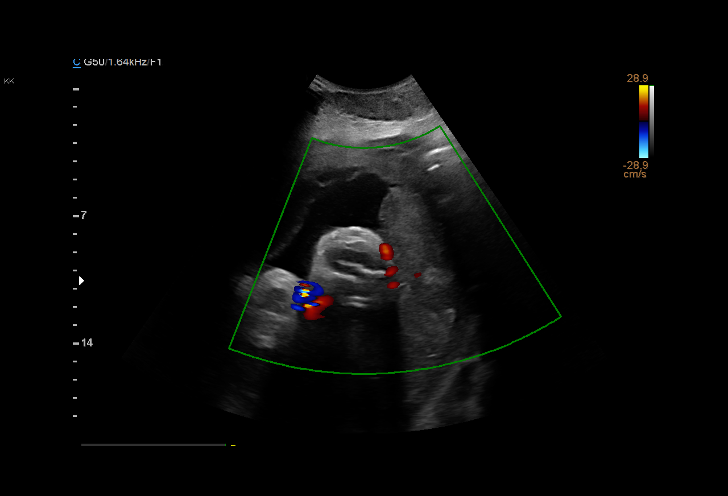
[im 12/21]
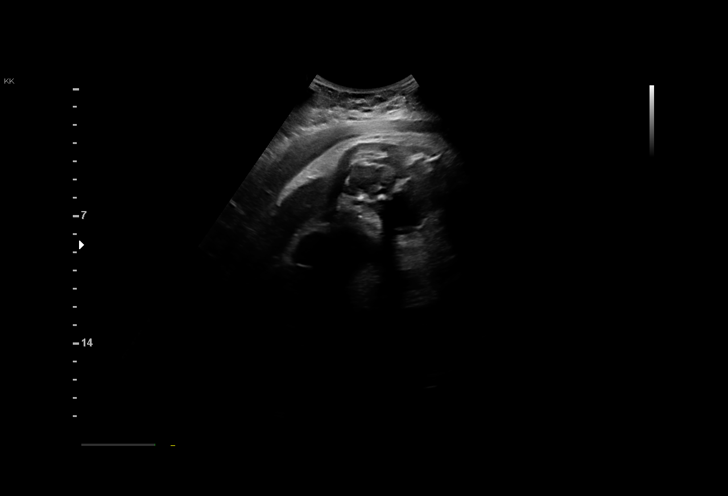
[im 14/21]
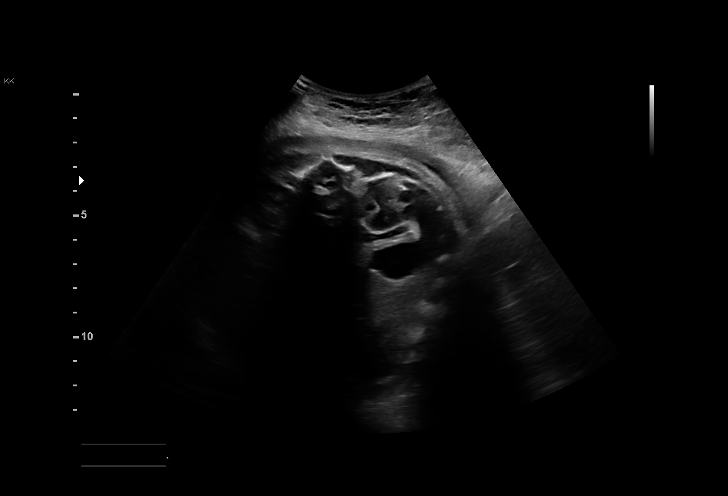
[im 15/21]
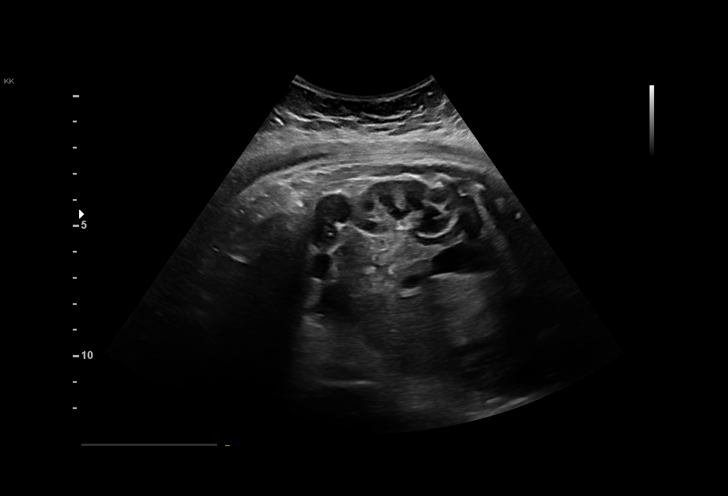
[im 17/21]
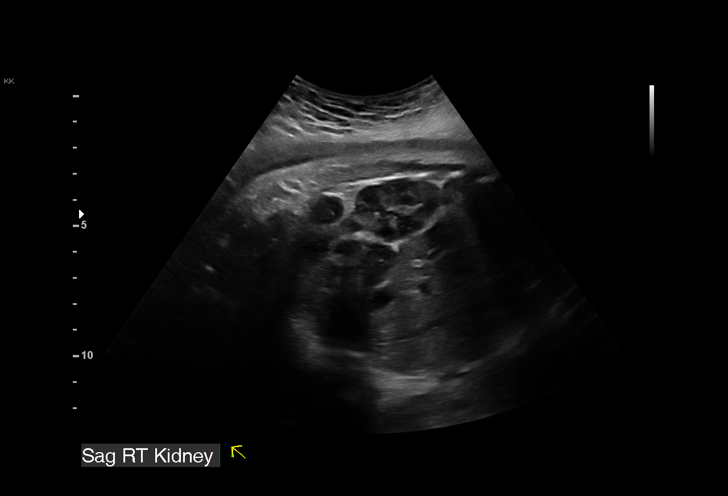
[im 18/21]
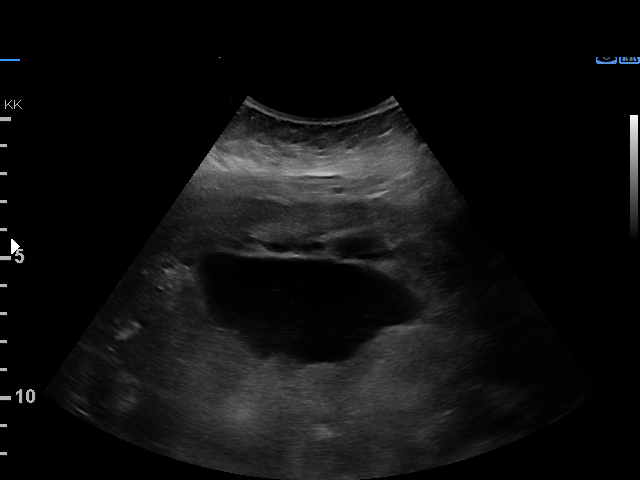
[im 19/21]
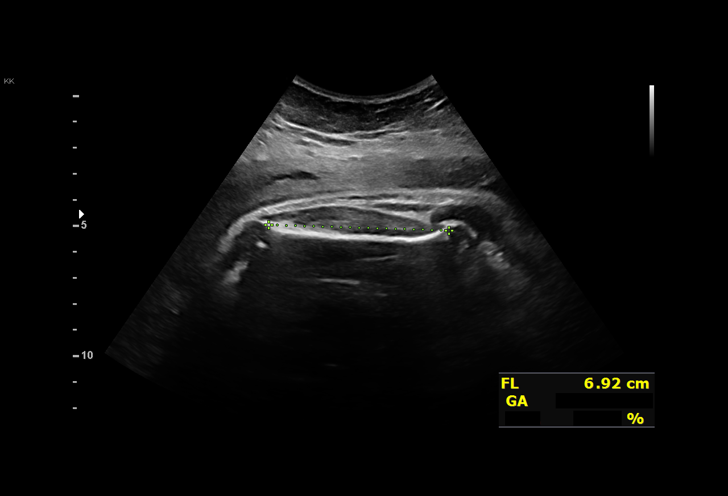
[im 21/21]
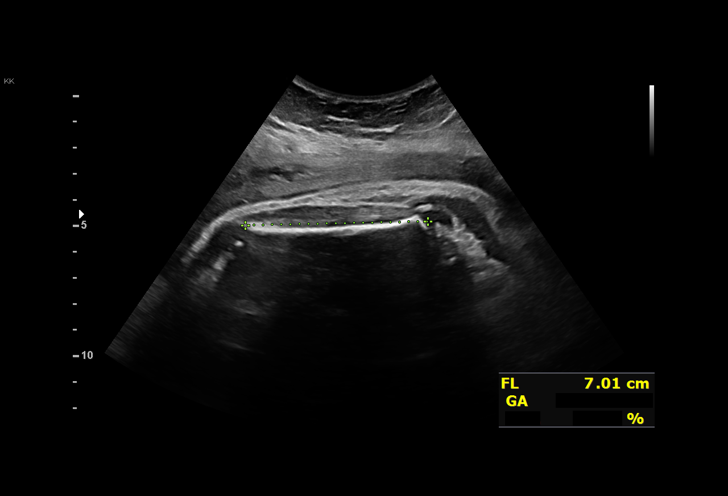

[15 of 21 positions shown; findings below may reference images not displayed]

Suites
 Referred By:      SASO MISA CADEZ             [facility]                                   [[facility]]

 1  US MFM OB LIMITED                     [address].01    ALDENOR EUGENIO

Indications

 Vaginal bleeding in pregnancy, third trimester
 Insufficient Prenatal Care
 Hypertension - Gestational
 Poor obstetrical history (History of
 Postpartum Hemorrhage)
 Pre-existing diabetes, type 2, in pregnancy,
 third trimester (Metformin and Insulin)
 Poor obstetric history: Prior fetal
 macrosomia, antepartum (4000g)
 Advanced maternal age multigravida 35+,
 third trimester
 37 weeks gestation of pregnancy
 Fetal heart rate decelerations affecting
 management of mother
Fetal Evaluation

 Num Of Fetuses:          1
 Fetal Heart Rate(bpm):   119
 Cardiac Activity:        Observed
 Presentation:            Cephalic
 Placenta:                Posterior
 P. Cord Insertion:       Not well visualized

 Comment:    Moderate subchorionic hemorrhage noted
Biometry

 FL:       69.9  mm     G. Age:  35w 6d         16  %
OB History

 Gravidity:    4         Term:   3        Prem:   0        SAB:   0
 TOP:          0       Ectopic:  0        Living: 3
Gestational Age

 LMP:           38w 4d        Date:  04/28/19                 EDD:   02/02/20
 Clinical EDD:  37w 2d                                        EDD:   02/11/20
 U/S Today:     35w 6d                                        EDD:   02/21/20
 Best:          37w 2d     Det. By:  Clinical EDD             EDD:   02/11/20
Anatomy

 Stomach:               Visualized             Bladder:                Visualized
 Kidneys:               Visualized
Impression

 Limited exam for vaginal bleeding.
 There is a moderate subchorionic hemorrhage noted.
 There were fetal decelerations observed.

 The exam ended early to move toward delivery
Recommendations

 The patient was taken to delivery.

## 2023-09-22 ENCOUNTER — Other Ambulatory Visit: Payer: Self-pay

## 2023-10-27 ENCOUNTER — Emergency Department: Admit: 2023-10-28

## 2023-10-27 DIAGNOSIS — R0789 Other chest pain: Secondary | ICD-10-CM

## 2023-10-27 MED ORDER — ibuprofenMOTRINtablet800mg
800 | Freq: Once | ORAL | Status: AC
Start: 2023-10-27 — End: 2023-10-27
  Administered 2023-10-28: 04:00:00 via ORAL

## 2023-10-27 MED ORDER — lidocaineLIDODERM51patch
5 | Freq: Once | TOPICAL | Status: AC
Start: 2023-10-27 — End: 2023-10-28
  Administered 2023-10-28: 04:00:00 via TRANSDERMAL
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
## 2023-10-27 NOTE — ED Triage Notes (Signed)
 Pt states she fell in her tub on Monday and is still having right sided rib pain. Pain with movement and palpation only. Denies sob

## 2023-10-27 NOTE — ED Provider Notes (Signed)
 This patient was seen by the advanced practice provider. I have seen and examined the patient, agree with the workup, evaluation, management and diagnosis. Care plan has been discussed.      Assessment reveals a 38 year old female who presents with a chief complaint of rib pain, fall.  Patient fell on the right side of her ribs on Monday.  Still having pain.    General: This is a wd/wn female in no acute distress who appears their stated age.     HEENT: NC/AT. PERRL. OP clear. MMM.     Neck: Supple. Trachea midline.    Pulmonary: Normal work of breathing, not using accessory muscles or pursed lip breathing    Cardiac: RRR    Chest wall: Tenderness to palpation over right ribs    Abdomen: non distended, non tender    Musculoskeletal: no deformities    Vascular: +2 radial and DP pulses.     Skin: Warm and dry with no lesions noted    Neuro:  AAOx4.  Face is grossly symmetric.  Gait not assessed. Moving all 4 extremities equally and spontaneously

## 2023-10-27 NOTE — ED Notes (Signed)
 Bed: C22W  Expected date:   Expected time:   Means of arrival: Car  Comments:

## 2023-10-28 ENCOUNTER — Inpatient Hospital Stay: Admit: 2023-10-28 | Discharge: 2023-10-28 | Disposition: A | Discharge status: Home / Self Care | Arrived: VH

## 2023-10-28 MED ORDER — lidocaineLIDODERM5
5 | MEDICATED_PATCH | TOPICAL | 0 refills | 30.00000 days | Status: AC
Start: 2023-10-28 — End: ?

## 2023-10-28 MED ORDER — ibuprofenMOTRIN800MGtablet
800 | ORAL_TABLET | Freq: Three times a day (TID) | ORAL | 0 refills | 5.00000 days | Status: AC | PRN
Start: 2023-10-28 — End: ?

## 2023-10-28 MED ORDER — methocarbamoLROBAXIN500MGtablet
500 | ORAL_TABLET | Freq: Three times a day (TID) | ORAL | 0 refills | 10.00000 days | Status: AC | PRN
Start: 2023-10-28 — End: ?

## 2023-10-28 MED ORDER — methocarbamoLROBAXINtablet500mg
500 | Freq: Once | ORAL | Status: AC
Start: 2023-10-28 — End: 2023-10-28
  Administered 2023-10-28: 05:00:00 via ORAL

## 2023-10-28 MED ORDER — acetaminophenTYLENOLEXTRASTRENGTH500MGtablet
500 | ORAL_TABLET | Freq: Four times a day (QID) | ORAL | 0 refills | 8.00000 days | Status: AC | PRN
Start: 2023-10-28 — End: ?

## 2023-10-28 MED ORDER — acetaminophenTYLENOLtablet975mg
325 | Freq: Once | ORAL | Status: AC
Start: 2023-10-28 — End: 2023-10-28
  Administered 2023-10-28: 05:00:00 via ORAL

## 2023-10-28 MED FILL — LIDODERM 5 % TOPICAL PATCH: 5 5 % | TOPICAL | Qty: 1 | Fill #0

## 2023-10-28 MED FILL — IBUPROFEN 800 MG TABLET: 800 800 MG | ORAL | Qty: 1 | Fill #0

## 2023-10-28 MED FILL — TYLENOL 325 MG TABLET: 325 325 mg | ORAL | Qty: 3 | Fill #0

## 2023-10-28 MED FILL — METHOCARBAMOL 500 MG TABLET: 500 500 MG | ORAL | Qty: 1 | Fill #0

## 2023-10-28 NOTE — Progress Notes (Signed)
 Patient instructed on the indication and proper use of incentive spirometer. Patient demonstrated an understanding of how to use device.

## 2023-10-28 NOTE — Discharge Instructions (Addendum)
 La radiografa de trax no mostr ninguna fractura costal evidente. Sin embargo, procederemos a tratarlo como tal. Le recetaron ibuprofeno y Tylenol para Engineer, materials. Le recetaron Robaxin, un relajante muscular que tambin le ayudar con Chief Technology Officer. Tenga en cuenta que Robaxin puede causar somnolencia; tenga precaucin al tomarlo. Tambin le recetaron parches de Lidoderm. Si son caros, pregunte al farmacutico por las versiones sin receta, que funcionan igual de bien y son significativamente ms econmicas.    Realice la espirometra incentivada al menos 10 veces al da para ayudar a abrir los pulmones y Research scientist (medical) neumona.    Por favor, regrese a urgencias si el dolor empeora, aumenta la dificultad para respirar, tiene fiebre o cualquier otra inquietud.        The chest x-ray did not show any obvious rib fracture.  However, we will go ahead and treat you as such.  You are prescribed ibuprofen and Tylenol for pain relief.  You were given a prescription for Robaxin which is a muscle relaxer which will also help with pain.  Be aware that the Robaxin can make you drowsy, use caution while taking it.  You are also given a prescription for Lidoderm patches.  If these are expensive, please ask the pharmacist for the over-the-counter versions which works just as well and are significantly cheaper.      Perform the incentive spirometry device at least 10 times a day to help open your lungs and prevent pneumonia from forming.    Please return back to the emergency department with worsening pain, increasing shortness of breath, fevers, or any other concerns
